# Patient Record
Sex: Female | Born: 1960 | Race: White | Hispanic: No | Marital: Married | Smoking: Former smoker
Health system: Southern US, Community
[De-identification: ages and names within clinical notes are randomized; demographics above are authoritative.]

## PROBLEM LIST (undated history)

## (undated) DIAGNOSIS — E079 Disorder of thyroid, unspecified: Secondary | ICD-10-CM

## (undated) DIAGNOSIS — M5090 Cervical disc disorder, unspecified, unspecified cervical region: Secondary | ICD-10-CM

## (undated) DIAGNOSIS — M5126 Other intervertebral disc displacement, lumbar region: Secondary | ICD-10-CM

## (undated) DIAGNOSIS — Z87820 Personal history of traumatic brain injury: Secondary | ICD-10-CM

## (undated) DIAGNOSIS — E785 Hyperlipidemia, unspecified: Secondary | ICD-10-CM

## (undated) HISTORY — DX: Personal history of traumatic brain injury: Z87.820

## (undated) HISTORY — DX: Other intervertebral disc displacement, lumbar region: M51.26

## (undated) HISTORY — DX: Disorder of thyroid, unspecified: E07.9

## (undated) HISTORY — PX: DILATION AND CURETTAGE OF UTERUS: SHX78

## (undated) HISTORY — DX: Cervical disc disorder, unspecified, unspecified cervical region: M50.90

## (undated) HISTORY — DX: Hyperlipidemia, unspecified: E78.5

---

## 1998-08-20 ENCOUNTER — Other Ambulatory Visit: Admission: RE | Admit: 1998-08-20 | Discharge: 1998-08-20 | Payer: Self-pay | Admitting: Family Medicine

## 1999-06-13 ENCOUNTER — Encounter: Admission: RE | Admit: 1999-06-13 | Discharge: 1999-06-13 | Payer: Self-pay | Admitting: *Deleted

## 1999-10-14 ENCOUNTER — Other Ambulatory Visit: Admission: RE | Admit: 1999-10-14 | Discharge: 1999-10-14 | Payer: Self-pay

## 2000-05-13 ENCOUNTER — Encounter: Admission: RE | Admit: 2000-05-13 | Discharge: 2000-06-15 | Payer: Self-pay | Admitting: *Deleted

## 2000-07-30 ENCOUNTER — Other Ambulatory Visit: Admission: RE | Admit: 2000-07-30 | Discharge: 2000-07-30 | Payer: Self-pay | Admitting: Family Medicine

## 2001-08-09 ENCOUNTER — Other Ambulatory Visit: Admission: RE | Admit: 2001-08-09 | Discharge: 2001-08-09 | Payer: Self-pay | Admitting: Family Medicine

## 2002-08-22 ENCOUNTER — Other Ambulatory Visit: Admission: RE | Admit: 2002-08-22 | Discharge: 2002-08-22 | Payer: Self-pay | Admitting: Family Medicine

## 2003-09-10 ENCOUNTER — Other Ambulatory Visit: Admission: RE | Admit: 2003-09-10 | Discharge: 2003-09-10 | Payer: Self-pay | Admitting: Family Medicine

## 2004-11-11 ENCOUNTER — Other Ambulatory Visit: Admission: RE | Admit: 2004-11-11 | Discharge: 2004-11-11 | Payer: Self-pay | Admitting: Family Medicine

## 2006-03-16 ENCOUNTER — Other Ambulatory Visit: Admission: RE | Admit: 2006-03-16 | Discharge: 2006-03-16 | Payer: Self-pay | Admitting: Family Medicine

## 2012-01-01 ENCOUNTER — Other Ambulatory Visit: Payer: Self-pay | Admitting: Family Medicine

## 2012-01-01 DIAGNOSIS — R102 Pelvic and perineal pain: Secondary | ICD-10-CM

## 2012-01-05 ENCOUNTER — Ambulatory Visit
Admission: RE | Admit: 2012-01-05 | Discharge: 2012-01-05 | Disposition: A | Discharge status: Home / Self Care | Payer: BC Managed Care – PPO | Source: Ambulatory Visit | Attending: Family Medicine | Admitting: Family Medicine

## 2012-01-05 DIAGNOSIS — R102 Pelvic and perineal pain: Secondary | ICD-10-CM

## 2012-06-30 ENCOUNTER — Encounter: Payer: Self-pay | Admitting: Internal Medicine

## 2012-08-15 ENCOUNTER — Encounter: Payer: BC Managed Care – PPO | Admitting: Internal Medicine

## 2013-07-05 ENCOUNTER — Telehealth: Payer: Self-pay | Admitting: *Deleted

## 2013-07-05 NOTE — Telephone Encounter (Signed)
PT NEEDS KARIVA 28 FILLED TO [facility]. I COULDN'T LOCATE IN DATABASE TO PUT MED ORDER IN. CAN YOU FILL PLEASE. HAS OFFICE VISIT WITH MMM THIS MONTH. LAST PAP 2/12.

## 2013-07-07 MED ORDER — DESOGESTREL-ETHINYL ESTRADIOL 0.15-0.02/0.01 MG (21/5) PO TABS: 1.0000 | ORAL_TABLET | Freq: Every day | ORAL | Status: DC

## 2013-07-07 NOTE — Telephone Encounter (Signed)
Has appt for pap this month with you

## 2013-07-10 ENCOUNTER — Other Ambulatory Visit: Payer: Self-pay | Admitting: General Practice

## 2013-07-10 DIAGNOSIS — IMO0001 Reserved for inherently not codable concepts without codable children: Secondary | ICD-10-CM

## 2013-07-10 MED ORDER — DESOGESTREL-ETHINYL ESTRADIOL 0.15-0.02/0.01 MG (21/5) PO TABS: 1.0000 | ORAL_TABLET | Freq: Every day | ORAL | Status: DC

## 2013-07-10 NOTE — Telephone Encounter (Signed)
Med called in and script sent

## 2013-07-11 ENCOUNTER — Other Ambulatory Visit: Payer: Self-pay

## 2013-07-11 DIAGNOSIS — IMO0001 Reserved for inherently not codable concepts without codable children: Secondary | ICD-10-CM

## 2013-07-11 MED ORDER — DESOGESTREL-ETHINYL ESTRADIOL 0.15-0.02/0.01 MG (21/5) PO TABS: 1.0000 | ORAL_TABLET | Freq: Every day | ORAL | Status: DC

## 2013-07-12 ENCOUNTER — Other Ambulatory Visit: Payer: Self-pay | Admitting: Nurse Practitioner

## 2013-07-18 ENCOUNTER — Other Ambulatory Visit: Payer: Self-pay | Admitting: Nurse Practitioner

## 2013-07-26 ENCOUNTER — Encounter: Payer: Self-pay | Admitting: Nurse Practitioner

## 2013-07-26 ENCOUNTER — Ambulatory Visit (INDEPENDENT_AMBULATORY_CARE_PROVIDER_SITE_OTHER): Payer: PRIVATE HEALTH INSURANCE | Admitting: Nurse Practitioner

## 2013-07-26 VITALS — BP 141/80 | HR 90 | Temp 100.1°F | Ht 64.0 in | Wt 139.0 lb

## 2013-07-26 DIAGNOSIS — Z23 Encounter for immunization: Secondary | ICD-10-CM

## 2013-07-26 DIAGNOSIS — Z01419 Encounter for gynecological examination (general) (routine) without abnormal findings: Secondary | ICD-10-CM

## 2013-07-26 DIAGNOSIS — Z0000 Encounter for general adult medical examination without abnormal findings: Secondary | ICD-10-CM

## 2013-07-26 DIAGNOSIS — E039 Hypothyroidism, unspecified: Secondary | ICD-10-CM | POA: Insufficient documentation

## 2013-07-26 DIAGNOSIS — Z124 Encounter for screening for malignant neoplasm of cervix: Secondary | ICD-10-CM

## 2013-07-26 LAB — POCT CBC
Granulocyte percent: 59.4 %G (ref 37–80)
HCT, POC: 39.2 % (ref 37.7–47.9)
POC Granulocyte: 6 (ref 2–6.9)
RBC: 4.1 M/uL (ref 4.04–5.48)
RDW, POC: 12.3 %
WBC: 10.1 10*3/uL (ref 4.6–10.2)

## 2013-07-26 NOTE — Patient Instructions (Addendum)

## 2013-07-26 NOTE — Progress Notes (Signed)
Subjective:    Patient ID: Amanda Hardy, female    DOB: Mar 30, 1961, 52 y.o.   MRN: [id number]  HPI Patient here today for CPE and PAP- She is doing well without compalints. Current medical problems: Patient Active Problem List   Diagnosis Date Noted  . Hypothyroidism 07/26/2013   Outpatient Encounter Prescriptions as of 07/26/2013  Medication Sig Dispense Refill  . desogestrel-ethinyl estradiol (KARIVA,AZURETTE,MIRCETTE) 0.15-0.02/0.01 MG (21/5) tablet Take 1 tablet by mouth daily.  1 Package  3  . levothyroxine (SYNTHROID, LEVOTHROID) 50 MCG tablet Take 50 mcg by mouth daily before breakfast.       No facility-administered encounter medications on file as of 07/26/2013.         Review of Systems  Constitutional: Positive for fever. Negative for chills.  HENT: Positive for congestion, rhinorrhea and sneezing.   Eyes: Negative.   Respiratory: Negative.   Cardiovascular: Negative.   Gastrointestinal: Negative.   Endocrine: Negative.   Genitourinary: Negative.   Musculoskeletal: Negative.   Neurological: Negative.        Objective:   Physical Exam  Vitals reviewed. Constitutional: She is oriented to person, place, and time. She appears well-developed and well-nourished.  HENT:  Head: Normocephalic.  Right Ear: Hearing, tympanic membrane, external ear and ear canal normal.  Left Ear: Hearing, tympanic membrane, external ear and ear canal normal.  Nose: Nose normal.  Mouth/Throat: Uvula is midline and oropharynx is clear and moist.  Eyes: Conjunctivae and EOM are normal. Pupils are equal, round, and reactive to light.  Neck: Normal range of motion and full passive range of motion without pain. Neck supple. No JVD present. Carotid bruit is not present. No mass and no thyromegaly present.  Cardiovascular: Normal rate, normal heart sounds and intact distal pulses.   No murmur heard. Pulmonary/Chest: Effort normal and breath sounds normal. Right breast exhibits no  inverted nipple, no mass, no nipple discharge, no skin change and no tenderness. Left breast exhibits no inverted nipple, no mass, no nipple discharge, no skin change and no tenderness.  Abdominal: Soft. Bowel sounds are normal. She exhibits no mass. There is no tenderness.  Genitourinary: Vagina normal and uterus normal. No breast swelling, tenderness, discharge or bleeding.  bimanual exam-No adnexal masses or tenderness.  Cervix parous and pink  Musculoskeletal: Normal range of motion.  Lymphadenopathy:    She has no cervical adenopathy.  Neurological: She is alert and oriented to person, place, and time.  Skin: Skin is warm and dry.  Psychiatric: She has a normal mood and affect. Her behavior is normal. Judgment and thought content normal.     BP 141/80  Pulse 90  Temp(Src) 100.1 F (37.8 C) (Oral)  Ht 5\' 4"  (1.626 m)  Wt 139 lb (63.05 kg)  BMI 23.85 kg/m2      Assessment & Plan:   1. Hypothyroidism   2. Annual physical exam   3. Encounter for routine gynecological examination    Orders Placed This Encounter  Procedures  . CMP14+EGFR  . NMR, lipoprofile  . Thyroid Panel With TSH  . POCT CBC   Meds ordered this encounter  Medications  . levothyroxine (SYNTHROID, LEVOTHROID) 50 MCG tablet    Sig: Take 50 mcg by mouth daily before breakfast.    Continue all meds Labs pending Diet and exercise encouraged Health maintenance reviewed Follow up in 6 months discussed extended use of birth control- patient is going to stop taking and see how she does  Mary-Margaret Daphine Deutscher, FNP

## 2013-07-28 LAB — NMR, LIPOPROFILE
Cholesterol: 226 mg/dL — ABNORMAL HIGH (ref <200)
HDL Particle Number: 49.1 umol/L (ref >=30.5)
LDL Particle Number: 2666 nmol/L — ABNORMAL HIGH (ref <1000)
LDL Size: 20.3 nm — ABNORMAL LOW (ref >20.5)
Small LDL Particle Number: 1685 nmol/L — ABNORMAL HIGH (ref <=527)
Triglycerides by NMR: 134 mg/dL (ref <150)

## 2013-07-28 LAB — CMP14+EGFR
Albumin/Globulin Ratio: 1.5 (ref 1.1–2.5)
Albumin: 4.3 g/dL (ref 3.5–5.5)
Alkaline Phosphatase: 39 IU/L (ref 39–117)
BUN: 10 mg/dL (ref 6–24)
Creatinine, Ser: 0.79 mg/dL (ref 0.57–1.00)
GFR calc Af Amer: 99 mL/min/{1.73_m2} (ref >59)
GFR calc non Af Amer: 86 mL/min/{1.73_m2} (ref >59)
Globulin, Total: 2.9 g/dL (ref 1.5–4.5)
Glucose: 118 mg/dL — ABNORMAL HIGH (ref 65–99)
Sodium: 140 mmol/L (ref 134–144)
Total Bilirubin: 0.1 mg/dL (ref 0.0–1.2)
Total Protein: 7.2 g/dL (ref 6.0–8.5)

## 2013-07-28 LAB — THYROID PANEL WITH TSH
T3 Uptake Ratio: 20 % — ABNORMAL LOW (ref 24–39)
T4, Total: 9.4 ug/dL (ref 4.5–12.0)

## 2013-07-31 LAB — PAP IG W/ RFLX HPV ASCU: PAP Smear Comment: 0

## 2013-08-01 ENCOUNTER — Other Ambulatory Visit: Payer: Self-pay | Admitting: Nurse Practitioner

## 2013-08-01 MED ORDER — LEVOTHYROXINE SODIUM 88 MCG PO TABS
88.0000 ug | ORAL_TABLET | Freq: Every day | ORAL | Status: DC
Start: 2013-08-01 — End: 2013-11-21

## 2013-09-15 ENCOUNTER — Other Ambulatory Visit (INDEPENDENT_AMBULATORY_CARE_PROVIDER_SITE_OTHER): Payer: PRIVATE HEALTH INSURANCE

## 2013-09-15 DIAGNOSIS — E039 Hypothyroidism, unspecified: Secondary | ICD-10-CM

## 2013-09-15 NOTE — Progress Notes (Signed)
Pt came in for labs only 

## 2013-11-21 ENCOUNTER — Other Ambulatory Visit: Payer: Self-pay

## 2013-11-21 MED ORDER — LEVOTHYROXINE SODIUM 88 MCG PO TABS: 88.0000 ug | ORAL_TABLET | Freq: Every day | ORAL | Status: DC

## 2013-12-25 ENCOUNTER — Encounter: Payer: Self-pay | Admitting: Family Medicine

## 2013-12-25 ENCOUNTER — Ambulatory Visit (INDEPENDENT_AMBULATORY_CARE_PROVIDER_SITE_OTHER): Payer: 59 | Admitting: Family Medicine

## 2013-12-25 VITALS — BP 136/84 | HR 104 | Temp 99.1°F | Ht 64.5 in | Wt 140.2 lb

## 2013-12-25 DIAGNOSIS — R52 Pain, unspecified: Secondary | ICD-10-CM

## 2013-12-25 DIAGNOSIS — J029 Acute pharyngitis, unspecified: Secondary | ICD-10-CM

## 2013-12-25 DIAGNOSIS — R509 Fever, unspecified: Secondary | ICD-10-CM

## 2013-12-25 DIAGNOSIS — B029 Zoster without complications: Secondary | ICD-10-CM

## 2013-12-25 LAB — POCT INFLUENZA A/B
Influenza A, POC: NEGATIVE
Influenza B, POC: NEGATIVE

## 2013-12-25 LAB — POCT RAPID STREP A (OFFICE): Rapid Strep A Screen: POSITIVE — AB

## 2013-12-25 MED ORDER — ACYCLOVIR 800 MG PO TABS: 800.0000 mg | ORAL_TABLET | Freq: Every day | ORAL | Status: DC

## 2013-12-25 MED ORDER — AZITHROMYCIN 250 MG PO TABS: ORAL_TABLET | ORAL | Status: DC

## 2013-12-25 NOTE — Progress Notes (Signed)
   Subjective:    Patient ID: Amanda Hardy, female    DOB: 11-Oct-1961, 53 y.o.   MRN: [id number]  HPI  This 53 y.o. female presents for evaluation of sore throat, headache, fever, malaise, and she has  A rash over her back that is numb and painful.  Review of Systems C/o sore throat, headache, malaise, and rash   No chest pain, SOB,dizziness, vision change, N/V, diarrhea, constipation, dysuria, urinary urgency or frequency  Objective:   Physical Exam Vital signs noted  Well developed well nourished female.  HEENT - Head atraumatic Normocephalic                Eyes - PERRLA, Conjuctiva - clear Sclera- Clear EOMI                Ears - EAC's Wnl TM's Wnl Gross Hearing WNL                Nose - Nares patent                 Throat - oropharanx injected with exudates Respiratory - Lungs CTA bilateral Cardiac - RRR S1 and S2 without murmur GI - Abdomen soft Nontender and bowel sounds active x 4 Extremities - No edema. Neuro - Grossly intact. Skin - erythematous rash with vesicle over left back      Results for orders placed in visit on 12/25/13  POCT RAPID STREP A (OFFICE)      Result Value Ref Range   Rapid Strep A Screen Positive (*) Negative  POCT INFLUENZA A/B      Result Value Ref Range   Influenza A, POC Negative     Influenza B, POC Negative     Assessment & Plan:  Sore throat - Plan: POCT rapid strep A, POCT Influenza A/B, azithromycin (ZITHROMAX) 250 MG tablet  Fever - Plan: POCT rapid strep A, POCT Influenza A/B, azithromycin (ZITHROMAX) 250 MG tablet  Body aches - Plan: POCT rapid strep A, POCT Influenza A/B, azithromycin (ZITHROMAX) 250 MG tablet  Acute pharyngitis - Plan: azithromycin (ZITHROMAX) 250 MG tablet  Shingles - Plan: acyclovir (ZOVIRAX) 800 MG tablet  Push po fluids, rest, tylenol and motrin otc prn as directed for fever, arthralgias, and myalgias.  Follow up prn if sx's continue or persist.  Deatra CanterWilliam J Oxford FNP

## 2014-01-24 ENCOUNTER — Encounter: Payer: Self-pay | Admitting: Nurse Practitioner

## 2014-01-24 ENCOUNTER — Ambulatory Visit (INDEPENDENT_AMBULATORY_CARE_PROVIDER_SITE_OTHER): Payer: 59 | Admitting: Nurse Practitioner

## 2014-01-24 VITALS — BP 148/71 | HR 87 | Temp 97.2°F | Ht 64.5 in | Wt 138.2 lb

## 2014-01-24 DIAGNOSIS — IMO0001 Reserved for inherently not codable concepts without codable children: Secondary | ICD-10-CM

## 2014-01-24 DIAGNOSIS — E039 Hypothyroidism, unspecified: Secondary | ICD-10-CM

## 2014-01-24 DIAGNOSIS — Z309 Encounter for contraceptive management, unspecified: Secondary | ICD-10-CM

## 2014-01-24 MED ORDER — DESOGESTREL-ETHINYL ESTRADIOL 0.15-0.02/0.01 MG (21/5) PO TABS: 1.0000 | ORAL_TABLET | Freq: Every day | ORAL | Status: DC

## 2014-01-24 NOTE — Patient Instructions (Signed)
Fat and Cholesterol Control Diet Fat and cholesterol levels in your blood and organs are influenced by your diet. High levels of fat and cholesterol may lead to diseases of the heart, small and large blood vessels, gallbladder, liver, and pancreas. CONTROLLING FAT AND CHOLESTEROL WITH DIET Although exercise and lifestyle factors are important, your diet is key. That is because certain foods are known to raise cholesterol and others to lower it. The goal is to balance foods for their effect on cholesterol and more importantly, to replace saturated and trans fat with other types of fat, such as monounsaturated fat, polyunsaturated fat, and omega-3 fatty acids. On average, a person should consume no more than 15 to 17 g of saturated fat daily. Saturated and trans fats are considered "bad" fats, and they will raise LDL cholesterol. Saturated fats are primarily found in animal products such as meats, butter, and cream. However, that does not mean you need to give up all your favorite foods. Today, there are good tasting, low-fat, low-cholesterol substitutes for most of the things you like to eat. Choose low-fat or nonfat alternatives. Choose round or loin cuts of red meat. These types of cuts are lowest in fat and cholesterol. Chicken (without the skin), fish, veal, and ground turkey breast are great choices. Eliminate fatty meats, such as hot dogs and salami. Even shellfish have little or no saturated fat. Have a 3 oz (85 g) portion when you eat lean meat, poultry, or fish. Trans fats are also called "partially hydrogenated oils." They are oils that have been scientifically manipulated so that they are solid at room temperature resulting in a longer shelf life and improved taste and texture of foods in which they are added. Trans fats are found in stick margarine, some tub margarines, cookies, crackers, and baked goods.  When baking and cooking, oils are a great substitute for butter. The monounsaturated oils are  especially beneficial since it is believed they lower LDL and raise HDL. The oils you should avoid entirely are saturated tropical oils, such as coconut and palm.  Remember to eat a lot from food groups that are naturally free of saturated and trans fat, including fish, fruit, vegetables, beans, grains (barley, rice, couscous, bulgur wheat), and pasta (without cream sauces).  IDENTIFYING FOODS THAT LOWER FAT AND CHOLESTEROL  Soluble fiber may lower your cholesterol. This type of fiber is found in fruits such as apples, vegetables such as broccoli, potatoes, and carrots, legumes such as beans, peas, and lentils, and grains such as barley. Foods fortified with plant sterols (phytosterol) may also lower cholesterol. You should eat at least 2 g per day of these foods for a cholesterol lowering effect.  Read package labels to identify low-saturated fats, trans fat free, and low-fat foods at the supermarket. Select cheeses that have only 2 to 3 g saturated fat per ounce. Use a heart-healthy tub margarine that is free of trans fats or partially hydrogenated oil. When buying baked goods (cookies, crackers), avoid partially hydrogenated oils. Breads and muffins should be made from whole grains (whole-wheat or whole oat flour, instead of "flour" or "enriched flour"). Buy non-creamy canned soups with reduced salt and no added fats.  FOOD PREPARATION TECHNIQUES  Never deep-fry. If you must fry, either stir-fry, which uses very little fat, or use non-stick cooking sprays. When possible, broil, bake, or roast meats, and steam vegetables. Instead of putting butter or margarine on vegetables, use lemon and herbs, applesauce, and cinnamon (for squash and sweet potatoes). Use nonfat   yogurt, salsa, and low-fat dressings for salads.  LOW-SATURATED FAT / LOW-FAT FOOD SUBSTITUTES Meats / Saturated Fat (g)  Avoid: Steak, marbled (3 oz/85 g) / 11 g  Choose: Steak, lean (3 oz/85 g) / 4 g  Avoid: Hamburger (3 oz/85 g) / 7  g  Choose: Hamburger, lean (3 oz/85 g) / 5 g  Avoid: Ham (3 oz/85 g) / 6 g  Choose: Ham, lean cut (3 oz/85 g) / 2.4 g  Avoid: Chicken, with skin, dark meat (3 oz/85 g) / 4 g  Choose: Chicken, skin removed, dark meat (3 oz/85 g) / 2 g  Avoid: Chicken, with skin, light meat (3 oz/85 g) / 2.5 g  Choose: Chicken, skin removed, light meat (3 oz/85 g) / 1 g Dairy / Saturated Fat (g)  Avoid: Whole milk (1 cup) / 5 g  Choose: Low-fat milk, 2% (1 cup) / 3 g  Choose: Low-fat milk, 1% (1 cup) / 1.5 g  Choose: Skim milk (1 cup) / 0.3 g  Avoid: Hard cheese (1 oz/28 g) / 6 g  Choose: Skim milk cheese (1 oz/28 g) / 2 to 3 g  Avoid: Cottage cheese, 4% fat (1 cup) / 6.5 g  Choose: Low-fat cottage cheese, 1% fat (1 cup) / 1.5 g  Avoid: Ice cream (1 cup) / 9 g  Choose: Sherbet (1 cup) / 2.5 g  Choose: Nonfat frozen yogurt (1 cup) / 0.3 g  Choose: Frozen fruit bar / trace  Avoid: Whipped cream (1 tbs) / 3.5 g  Choose: Nondairy whipped topping (1 tbs) / 1 g Condiments / Saturated Fat (g)  Avoid: Mayonnaise (1 tbs) / 2 g  Choose: Low-fat mayonnaise (1 tbs) / 1 g  Avoid: Butter (1 tbs) / 7 g  Choose: Extra light margarine (1 tbs) / 1 g  Avoid: Coconut oil (1 tbs) / 11.8 g  Choose: Olive oil (1 tbs) / 1.8 g  Choose: Corn oil (1 tbs) / 1.7 g  Choose: Safflower oil (1 tbs) / 1.2 g  Choose: Sunflower oil (1 tbs) / 1.4 g  Choose: Soybean oil (1 tbs) / 2.4 g  Choose: Canola oil (1 tbs) / 1 g Document Released: 10/19/2005 Document Revised: 02/13/2013 Document Reviewed: 04/09/2011 ExitCare Patient Information 2014 [place], [place]. Sodium-Controlled Diet Sodium is a mineral. It is found in many foods. Sodium may be found naturally or added during the making of a food. The most common form of sodium is salt, which is made up of sodium and chloride. Reducing your sodium intake involves changing your eating habits. The following guidelines will help you reduce the sodium in your  diet:  Stop using the salt shaker.  Use salt sparingly in cooking and baking.  Substitute with sodium-free seasonings and spices.  Do not use a salt substitute (potassium chloride) without your caregiver's permission.  Include a variety of fresh, unprocessed foods in your diet.  Limit the use of processed and convenience foods that are high in sodium. USE THE FOLLOWING FOODS SPARINGLY: Breads/Starches  Commercial bread stuffing, commercial pancake or waffle mixes, coating mixes. Waffles. Croutons. Prepared (boxed or frozen) potato, rice, or noodle mixes that contain salt or sodium. Salted [place] fries or hash browns. Salted popcorn, breads, crackers, chips, or snack foods. Vegetables  Vegetables canned with salt or prepared in cream, butter, or cheese sauces. Sauerkraut. Tomato or vegetable juices canned with salt.  Fresh vegetables are allowed if rinsed thoroughly. Fruit  Fruit is okay to eat. Meat and Meat  Substitutes  Salted or smoked meats, such as bacon or Canadian bacon, chipped or corned beef, hot dogs, salt pork, luncheon meats, pastrami, ham, or sausage. Canned or smoked fish, poultry, or meat. Processed cheese or cheese spreads, blue or [place] cheese. Battered or frozen fish products. Prepared spaghetti sauce. Baked beans. Reuben sandwiches. Salted nuts. Caviar. Milk  Limit buttermilk to 1 cup per week. Soups and Combination Foods  Bouillon cubes, canned or dried soups, broth, consomm. Convenience (frozen or packaged) dinners with more than 600 mg sodium. Pot pies, pizza, Asian food, fast food cheeseburgers, and specialty sandwiches. Desserts and Sweets  Regular (salted) desserts, pie, commercial fruit snack pies, commercial snack cakes, canned puddings.  Eat desserts and sweets in moderation. Fats and Oils  Gravy mixes or canned gravy. No more than 1 to 2 tbs of salad dressing. Chip dips.  Eat fats and oils in moderation. Beverages  See those listed under  the vegetables and milk groups. Condiments  Ketchup, mustard, meat sauces, salsa, regular (salted) and lite soy sauce or mustard. Dill pickles, olives, meat tenderizer. Prepared horseradish or pickle relish. Dutch-processed cocoa. Baking powder or baking soda used medicinally. Worcestershire sauce. "Light" salt. Salt substitute, unless approved by your caregiver. Document Released: 04/10/2002 Document Revised: 01/11/2012 Document Reviewed: 11/11/2009 [facility] Patient Information 2014 [place], [place].

## 2014-01-24 NOTE — Progress Notes (Signed)
   Subjective:    Patient ID: Amanda Hardy, female    DOB: 07/19/1961, 53 y.o.   MRN: [id number]  HPI Patient presents today for follow up of chronic medical problems. Is complaining of body aches. Had shingles last month that has since resolved.  Hypothyroidism Currently taking Synthyroid 88 mcg - exercises daily for at 30 mins. Denies fatigue. Has not been maintaining low fat diet.    Review of Systems  Constitutional: Negative for unexpected weight change.  Respiratory: Negative for shortness of breath.   Cardiovascular: Negative for chest pain and palpitations.  Gastrointestinal: Negative for nausea, diarrhea and constipation.  Endocrine: Negative for cold intolerance and heat intolerance.  Neurological: Negative for headaches.  Psychiatric/Behavioral: Negative for sleep disturbance.  All other systems reviewed and are negative.       Objective:   Physical Exam  Constitutional: She is oriented to person, place, and time. She appears well-developed and well-nourished.  HENT:  Head: Normocephalic.  Right Ear: External ear normal.  Mouth/Throat: Oropharynx is clear and moist.  Eyes: Pupils are equal, round, and reactive to light.  Neck: Normal range of motion. Neck supple.  Cardiovascular: Normal rate, regular rhythm and normal heart sounds.   Pulmonary/Chest: Effort normal and breath sounds normal.  Abdominal: Soft. Bowel sounds are normal.  Neurological: She is alert and oriented to person, place, and time.  Skin: Skin is warm and dry.  Psychiatric: She has a normal mood and affect. Her behavior is normal. Judgment and thought content normal.    BP 148/71  Pulse 87  Temp(Src) 97.2 F (36.2 C) (Oral)  Ht 5' 4.5" (1.638 m)  Wt 138 lb 3.2 oz (62.687 kg)  BMI 23.36 kg/m2  LMP 01/24/2014       Assessment & Plan:   1. Unspecified hypothyroidism   2. Birth control    Orders Placed This Encounter  Procedures  . CMP14+EGFR  . NMR, lipoprofile  .  Thyroid Panel With TSH   Meds ordered this encounter  Medications  . desogestrel-ethinyl estradiol (KARIVA,AZURETTE,MIRCETTE) 0.15-0.02/0.01 MG (21/5) tablet    Sig: Take 1 tablet by mouth daily.    Dispense:  1 Package    Refill:  11    Order Specific Question:  Supervising Provider    Answer:  Chipper Herb [place] pending Health maintenance reviewed Diet and exercise encouraged Continue all meds Follow up  In 1 year   [place], FNP

## 2014-01-26 ENCOUNTER — Other Ambulatory Visit: Payer: Self-pay | Admitting: Nurse Practitioner

## 2014-01-26 LAB — THYROID PANEL WITH TSH
FREE THYROXINE INDEX: 3.1 (ref 1.2–4.9)
T3 Uptake Ratio: 28 % (ref 24–39)
T4, Total: 11 ug/dL (ref 4.5–12.0)
TSH: 0.138 u[IU]/mL — AB (ref 0.450–4.500)

## 2014-01-26 LAB — CMP14+EGFR
ALT: 88 IU/L — ABNORMAL HIGH (ref 0–32)
AST: 46 IU/L — AB (ref 0–40)
Albumin/Globulin Ratio: 1.6 (ref 1.1–2.5)
Albumin: 4.6 g/dL (ref 3.5–5.5)
Alkaline Phosphatase: 59 IU/L (ref 39–117)
BUN / CREAT RATIO: 18 (ref 9–23)
BUN: 14 mg/dL (ref 6–24)
CALCIUM: 9.9 mg/dL (ref 8.7–10.2)
CHLORIDE: 100 mmol/L (ref 97–108)
CO2: 25 mmol/L (ref 18–29)
Creatinine, Ser: 0.78 mg/dL (ref 0.57–1.00)
GFR calc Af Amer: 100 mL/min/{1.73_m2} (ref >59)
GFR calc non Af Amer: 86 mL/min/{1.73_m2} (ref >59)
Globulin, Total: 2.8 g/dL (ref 1.5–4.5)
Glucose: 105 mg/dL — ABNORMAL HIGH (ref 65–99)
Potassium: 4 mmol/L (ref 3.5–5.2)
SODIUM: 140 mmol/L (ref 134–144)
Total Bilirubin: 0.3 mg/dL (ref 0.0–1.2)
Total Protein: 7.4 g/dL (ref 6.0–8.5)

## 2014-01-26 LAB — NMR, LIPOPROFILE
Cholesterol: 209 mg/dL — ABNORMAL HIGH (ref <200)
HDL CHOLESTEROL BY NMR: 48 mg/dL (ref >=40)
HDL Particle Number: 26.3 umol/L — ABNORMAL LOW (ref >=30.5)
LDL Particle Number: 1696 nmol/L — ABNORMAL HIGH (ref <1000)
LDL Size: 21.1 nm (ref >20.5)
LDLC SERPL CALC-MCNC: 128 mg/dL — AB (ref <100)
LP-IR SCORE: 35 (ref <=45)
Small LDL Particle Number: 686 nmol/L — ABNORMAL HIGH (ref <=527)
Triglycerides by NMR: 164 mg/dL — ABNORMAL HIGH (ref <150)

## 2014-01-26 MED ORDER — LEVOTHYROXINE SODIUM 75 MCG PO TABS: 75.0000 ug | ORAL_TABLET | Freq: Every day | ORAL | Status: DC

## 2014-01-31 ENCOUNTER — Encounter: Payer: Self-pay | Admitting: *Deleted

## 2014-02-16 ENCOUNTER — Telehealth: Payer: Self-pay | Admitting: Nurse Practitioner

## 2014-02-16 MED ORDER — NORGESTIM-ETH ESTRAD TRIPHASIC 0.18/0.215/0.25 MG-35 MCG PO TABS: 1.0000 | ORAL_TABLET | Freq: Every day | ORAL | Status: DC

## 2014-02-16 NOTE — Telephone Encounter (Signed)
rx changed and sent to pharmacy

## 2014-07-16 ENCOUNTER — Other Ambulatory Visit: Payer: Self-pay | Admitting: Nurse Practitioner

## 2014-08-08 ENCOUNTER — Telehealth: Payer: Self-pay | Admitting: Nurse Practitioner

## 2014-08-09 NOTE — Telephone Encounter (Signed)
Patient wanted to come by for labwork but did not have an appointment scheduled. She was ask to schedule an appointment and have the provider order during visit.

## 2014-08-16 ENCOUNTER — Ambulatory Visit (INDEPENDENT_AMBULATORY_CARE_PROVIDER_SITE_OTHER): Payer: PRIVATE HEALTH INSURANCE | Admitting: Nurse Practitioner

## 2014-08-16 ENCOUNTER — Encounter: Payer: Self-pay | Admitting: Nurse Practitioner

## 2014-08-16 VITALS — BP 154/81 | HR 87 | Temp 98.9°F | Ht 64.5 in | Wt 135.8 lb

## 2014-08-16 DIAGNOSIS — E038 Other specified hypothyroidism: Secondary | ICD-10-CM

## 2014-08-16 DIAGNOSIS — E034 Atrophy of thyroid (acquired): Secondary | ICD-10-CM

## 2014-08-16 NOTE — Progress Notes (Signed)
   Subjective:    Patient ID: Amanda Hardy, female    DOB: Feb 12, 1961, 53 y.o.   MRN: [id number]  HPI Patient here today for follow up of chronic medical problems.  Hypothyroidism Currently taking Synthyroid 88 mcg - exercises daily for at 30 mins. Denies fatigue. Has not been maintaining low fat diet.    Review of Systems  Constitutional: Negative for unexpected weight change.  Respiratory: Negative for shortness of breath.   Cardiovascular: Negative for chest pain and palpitations.  Gastrointestinal: Negative for nausea, diarrhea and constipation.  Endocrine: Negative for cold intolerance and heat intolerance.  Neurological: Negative for headaches.  Psychiatric/Behavioral: Negative for sleep disturbance.  All other systems reviewed and are negative.      Objective:   Physical Exam  Constitutional: She is oriented to person, place, and time. She appears well-developed and well-nourished.  HENT:  Head: Normocephalic.  Right Ear: External ear normal.  Mouth/Throat: Oropharynx is clear and moist.  Eyes: Pupils are equal, round, and reactive to light.  Neck: Normal range of motion. Neck supple.  Cardiovascular: Normal rate, regular rhythm and normal heart sounds.   Pulmonary/Chest: Effort normal and breath sounds normal.  Abdominal: Soft. Bowel sounds are normal.  Neurological: She is alert and oriented to person, place, and time.  Skin: Skin is warm and dry.  Psychiatric: She has a normal mood and affect. Her behavior is normal. Judgment and thought content normal.    BP 154/81  Pulse 87  Temp(Src) 98.9 F (37.2 C) (Oral)  Ht 5' 4.5" (1.638 m)  Wt 135 lb 12.8 oz (61.598 kg)  BMI 22.96 kg/m2  LMP 08/16/2014       Assessment & Plan:    1. Hypothyroidism due to acquired atrophy of thyroid - CMP14+EGFR - NMR, lipoprofile - Thyroid Panel With TSH   Refuses immunizations- wants to wait on flu shot Labs pending Health maintenance reviewed Diet and  exercise encouraged Continue all meds Follow up  In 6 months   [place], FNP

## 2014-08-16 NOTE — Patient Instructions (Signed)

## 2014-08-17 ENCOUNTER — Telehealth: Payer: Self-pay | Admitting: Nurse Practitioner

## 2014-08-17 LAB — NMR, LIPOPROFILE
Cholesterol: 233 mg/dL — ABNORMAL HIGH (ref 100–199)
HDL CHOLESTEROL BY NMR: 49 mg/dL (ref >39)
HDL Particle Number: 42.2 umol/L (ref >=30.5)
LDL PARTICLE NUMBER: 1952 nmol/L — AB (ref <1000)
LDL Size: 20.8 nm (ref >20.5)
LDLC SERPL CALC-MCNC: 150 mg/dL — AB (ref 0–99)
LP-IR Score: 30 (ref <=45)
Small LDL Particle Number: 803 nmol/L — ABNORMAL HIGH (ref <=527)
Triglycerides by NMR: 171 mg/dL — ABNORMAL HIGH (ref 0–149)

## 2014-08-17 LAB — CMP14+EGFR
A/G RATIO: 1.5 (ref 1.1–2.5)
ALT: 8 IU/L (ref 0–32)
AST: 14 IU/L (ref 0–40)
Albumin: 4.6 g/dL (ref 3.5–5.5)
Alkaline Phosphatase: 45 IU/L (ref 39–117)
BUN/Creatinine Ratio: 17 (ref 9–23)
BUN: 13 mg/dL (ref 6–24)
CALCIUM: 9.6 mg/dL (ref 8.7–10.2)
CO2: 20 mmol/L (ref 18–29)
Chloride: 102 mmol/L (ref 97–108)
Creatinine, Ser: 0.78 mg/dL (ref 0.57–1.00)
GFR calc Af Amer: 100 mL/min/{1.73_m2} (ref >59)
GFR calc non Af Amer: 86 mL/min/{1.73_m2} (ref >59)
GLUCOSE: 103 mg/dL — AB (ref 65–99)
Globulin, Total: 3.1 g/dL (ref 1.5–4.5)
POTASSIUM: 3.7 mmol/L (ref 3.5–5.2)
Sodium: 141 mmol/L (ref 134–144)
Total Bilirubin: <0.2 mg/dL (ref 0.0–1.2)
Total Protein: 7.7 g/dL (ref 6.0–8.5)

## 2014-08-17 LAB — THYROID PANEL WITH TSH
Free Thyroxine Index: 2 (ref 1.2–4.9)
T3 UPTAKE RATIO: 20 % — AB (ref 24–39)
T4 TOTAL: 9.9 ug/dL (ref 4.5–12.0)
TSH: 3.24 u[IU]/mL (ref 0.450–4.500)

## 2014-08-17 NOTE — Telephone Encounter (Signed)
Message copied by Doreatha MassedMOORE, MITZI on Fri Aug 17, 2014 11:08 AM ------      Message from: Bennie PieriniMARTIN, MARY-MARGARET      Created: Fri Aug 17, 2014  8:58 AM       Kidney and liver function stable      LDL particle numbers are elevated as well as LDL- currently low CVA risk- low fat diet and exercise      Continue current meds- low fat diet and exercise and recheck in 3 months       ------

## 2014-08-27 NOTE — Telephone Encounter (Signed)
Patient aware.

## 2014-10-11 ENCOUNTER — Other Ambulatory Visit: Payer: Self-pay | Admitting: Nurse Practitioner

## 2014-10-12 NOTE — Telephone Encounter (Signed)
Refilled per protocol; TSH done 08/2014

## 2015-01-04 ENCOUNTER — Ambulatory Visit (INDEPENDENT_AMBULATORY_CARE_PROVIDER_SITE_OTHER): Payer: BLUE CROSS/BLUE SHIELD | Admitting: Family

## 2015-01-04 ENCOUNTER — Encounter: Payer: Self-pay | Admitting: Family

## 2015-01-04 VITALS — BP 141/84 | HR 88 | Temp 98.3°F | Ht 64.5 in | Wt 134.0 lb

## 2015-01-04 DIAGNOSIS — J069 Acute upper respiratory infection, unspecified: Secondary | ICD-10-CM

## 2015-01-04 MED ORDER — AZITHROMYCIN 250 MG PO TABS: ORAL_TABLET | ORAL | Status: DC

## 2015-01-04 NOTE — Progress Notes (Signed)
   Subjective:    Patient ID: Amanda Hardy, female    DOB: 03-30-1961, 54 y.o.   MRN: [id number]  Sore Throat  This is a new problem. The current episode started 1 to 4 weeks ago. The problem has been unchanged. There has been no fever. The pain is mild. Associated symptoms include congestion, coughing, ear pain, headaches, a hoarse voice, shortness of breath and trouble swallowing. Pertinent negatives include no ear discharge or vomiting. She has had no exposure to strep or mono. She has tried acetaminophen for the symptoms. The treatment provided mild relief.      Review of Systems  Constitutional: Negative.   HENT: Positive for congestion, ear pain, hoarse voice and trouble swallowing. Negative for ear discharge.   Eyes: Negative.   Respiratory: Positive for cough and shortness of breath.   Cardiovascular: Negative.  Negative for palpitations.  Gastrointestinal: Negative.  Negative for vomiting.  Endocrine: Negative.   Genitourinary: Negative.   Musculoskeletal: Negative.   Neurological: Positive for headaches.  Hematological: Negative.   Psychiatric/Behavioral: Negative.   All other systems reviewed and are negative.      Objective:   Physical Exam  Constitutional: She is oriented to person, place, and time. She appears well-developed and well-nourished. No distress.  HENT:  Head: Normocephalic and atraumatic.  Right Ear: External ear normal.  Left Ear: External ear normal.  Mouth/Throat: Oropharynx is clear and moist.  Nasal passage erythemas with mild swelling    Eyes: Pupils are equal, round, and reactive to light.  Neck: Normal range of motion. Neck supple. No thyromegaly present.  Cardiovascular: Normal rate, regular rhythm, normal heart sounds and intact distal pulses.   No murmur heard. Pulmonary/Chest: Effort normal and breath sounds normal. No respiratory distress. She has no wheezes.  Abdominal: Soft. Bowel sounds are normal. She exhibits no  distension. There is no tenderness.  Musculoskeletal: Normal range of motion. She exhibits no edema or tenderness.  Neurological: She is alert and oriented to person, place, and time. She has normal reflexes. No cranial nerve deficit.  Skin: Skin is warm and dry.  Psychiatric: She has a normal mood and affect. Her behavior is normal. Judgment and thought content normal.  Vitals reviewed.     BP 141/84 mmHg  Pulse 88  Temp(Src) 98.3 F (36.8 C) (Oral)  Ht 5' 4.5" (1.638 m)  Wt 134 lb (60.782 kg)  BMI 22.65 kg/m2     Assessment & Plan:  1. Acute upper respiratory infection -- Take meds as prescribed - Use a cool mist humidifier  -Use saline nose sprays frequently -Saline irrigations of the nose can be very helpful if done frequently.  * 4X daily for 1 week*  * Use of a nettie pot can be helpful with this. Follow directions with this* -Force fluids -For any cough or congestion  Use plain Mucinex- regular strength or max strength is fine   * Children- consult with Pharmacist for dosing -For fever or aces or pains- take tylenol or ibuprofen appropriate for age and weight.  * for fevers greater than 101 orally you may alternate ibuprofen and tylenol every  3 hours. -Throat lozenges if help - azithromycin (ZITHROMAX) 250 MG tablet; Take 500 mg once, then 250 mg for four days  Dispense: 6 tablet; Refill: 0  Jannifer Rodneyhristy Anwyn Kriegel, FNP

## 2015-01-04 NOTE — Patient Instructions (Signed)
Upper Respiratory Infection, Adult An upper respiratory infection (URI) is also sometimes known as the common cold. The upper respiratory tract includes the nose, sinuses, throat, trachea, and bronchi. Bronchi are the airways leading to the lungs. Most people improve within 1 week, but symptoms can last up to 2 weeks. A residual cough may last even longer.  CAUSES Many different viruses can infect the tissues lining the upper respiratory tract. The tissues become irritated and inflamed and often become very moist. Mucus production is also common. A cold is contagious. You can easily spread the virus to others by oral contact. This includes kissing, sharing a glass, coughing, or sneezing. Touching your mouth or nose and then touching a surface, which is then touched by another person, can also spread the virus. SYMPTOMS  Symptoms typically develop 1 to 3 days after you come in contact with a cold virus. Symptoms vary from person to person. They may include:  Runny nose.  Sneezing.  Nasal congestion.  Sinus irritation.  Sore throat.  Loss of voice (laryngitis).  Cough.  Fatigue.  Muscle aches.  Loss of appetite.  Headache.  Low-grade fever. DIAGNOSIS  You might diagnose your own cold based on familiar symptoms, since most people get a cold 2 to 3 times a year. Your caregiver can confirm this based on your exam. Most importantly, your caregiver can check that your symptoms are not due to another disease such as strep throat, sinusitis, pneumonia, asthma, or epiglottitis. Blood tests, throat tests, and X-rays are not necessary to diagnose a common cold, but they may sometimes be helpful in excluding other more serious diseases. Your caregiver will decide if any further tests are required. RISKS AND COMPLICATIONS  You may be at risk for a more severe case of the common cold if you smoke cigarettes, have chronic heart disease (such as heart failure) or lung disease (such as asthma), or if  you have a weakened immune system. The very young and very old are also at risk for more serious infections. Bacterial sinusitis, middle ear infections, and bacterial pneumonia can complicate the common cold. The common cold can worsen asthma and chronic obstructive pulmonary disease (COPD). Sometimes, these complications can require emergency medical care and may be life-threatening. PREVENTION  The best way to protect against getting a cold is to practice good hygiene. Avoid oral or hand contact with people with cold symptoms. Wash your hands often if contact occurs. There is no clear evidence that vitamin C, vitamin E, echinacea, or exercise reduces the chance of developing a cold. However, it is always recommended to get plenty of rest and practice good nutrition. TREATMENT  Treatment is directed at relieving symptoms. There is no cure. Antibiotics are not effective, because the infection is caused by a virus, not by bacteria. Treatment may include:  Increased fluid intake. Sports drinks offer valuable electrolytes, sugars, and fluids.  Breathing heated mist or steam (vaporizer or shower).  Eating chicken soup or other clear broths, and maintaining good nutrition.  Getting plenty of rest.  Using gargles or lozenges for comfort.  Controlling fevers with ibuprofen or acetaminophen as directed by your caregiver.  Increasing usage of your inhaler if you have asthma. Zinc gel and zinc lozenges, taken in the first 24 hours of the common cold, can shorten the duration and lessen the severity of symptoms. Pain medicines may help with fever, muscle aches, and throat pain. A variety of non-prescription medicines are available to treat congestion and runny nose. Your caregiver   can make recommendations and may suggest nasal or lung inhalers for other symptoms.  HOME CARE INSTRUCTIONS   Only take over-the-counter or prescription medicines for pain, discomfort, or fever as directed by your  caregiver.  Use a warm mist humidifier or inhale steam from a shower to increase air moisture. This may keep secretions moist and make it easier to breathe.  Drink enough water and fluids to keep your urine clear or pale yellow.  Rest as needed.  Return to work when your temperature has returned to normal or as your caregiver advises. You may need to stay home longer to avoid infecting others. You can also use a face mask and careful hand washing to prevent spread of the virus. SEEK MEDICAL CARE IF:   After the first few days, you feel you are getting worse rather than better.  You need your caregiver's advice about medicines to control symptoms.  You develop chills, worsening shortness of breath, or brown or red sputum. These may be signs of pneumonia.  You develop yellow or brown nasal discharge or pain in the face, especially when you bend forward. These may be signs of sinusitis.  You develop a fever, swollen neck glands, pain with swallowing, or white areas in the back of your throat. These may be signs of strep throat. SEEK IMMEDIATE MEDICAL CARE IF:   You have a fever.  You develop severe or persistent headache, ear pain, sinus pain, or chest pain.  You develop wheezing, a prolonged cough, cough up blood, or have a change in your usual mucus (if you have chronic lung disease).  You develop sore muscles or a stiff neck. Document Released: 04/14/2001 Document Revised: 01/11/2012 Document Reviewed: 01/24/2014 [employer] Patient Information 2015 [employer]. This information is not intended to replace advice given to you by your health care provider. Make sure you discuss any questions you have with your health care provider.  - Take meds as prescribed - Use a cool mist humidifier  -Use saline nose sprays frequently -Saline irrigations of the nose can be very helpful if done frequently.  * 4X daily for 1 week*  * Use of a nettie pot can be helpful with this. Follow  directions with this* -Force fluids -For any cough or congestion  Use plain Mucinex- regular strength or max strength is fine   * Children- consult with Pharmacist for dosing -For fever or aces or pains- take tylenol or ibuprofen appropriate for age and weight.  * for fevers greater than 101 orally you may alternate ibuprofen and tylenol every  3 hours. -Throat lozenges if help   Italo Banton, FNP   

## 2015-01-24 ENCOUNTER — Ambulatory Visit (INDEPENDENT_AMBULATORY_CARE_PROVIDER_SITE_OTHER): Payer: BLUE CROSS/BLUE SHIELD | Admitting: Family Medicine

## 2015-01-24 ENCOUNTER — Encounter: Payer: Self-pay | Admitting: Family Medicine

## 2015-01-24 VITALS — BP 156/86 | HR 90 | Temp 98.3°F | Ht 64.5 in | Wt 133.0 lb

## 2015-01-24 DIAGNOSIS — R030 Elevated blood-pressure reading, without diagnosis of hypertension: Secondary | ICD-10-CM

## 2015-01-24 DIAGNOSIS — J069 Acute upper respiratory infection, unspecified: Secondary | ICD-10-CM | POA: Diagnosis not present

## 2015-01-24 DIAGNOSIS — H109 Unspecified conjunctivitis: Secondary | ICD-10-CM | POA: Diagnosis not present

## 2015-01-24 DIAGNOSIS — IMO0001 Reserved for inherently not codable concepts without codable children: Secondary | ICD-10-CM

## 2015-01-24 MED ORDER — AMOXICILLIN 500 MG PO CAPS: 500.0000 mg | ORAL_CAPSULE | Freq: Three times a day (TID) | ORAL | Status: DC

## 2015-01-24 MED ORDER — SULFACETAMIDE SODIUM 10 % OP SOLN: 1.0000 [drp] | Freq: Four times a day (QID) | OPHTHALMIC | Status: DC

## 2015-01-24 NOTE — Patient Instructions (Signed)
Take antibiotic as directed Use eyedrops as directed and use these in both eyes for the first 2-3 days and finish up in the most affected eye for a total of 7-10 days Take Tylenol as needed for aches pains and fever Use nasal saline frequently during the day

## 2015-01-24 NOTE — Progress Notes (Signed)
   Subjective:    Patient ID: Amanda Legatoamela Sue Barker Hardy, female    DOB: 19-Oct-1961, 54 y.o.   MRN: [id number]  HPI Patient here today for left eye redness and drainage. This was first noticed yesterday. There was a lot of drainage this morning.        Patient Active Problem List   Diagnosis Date Noted  . Hypothyroidism 07/26/2013   Outpatient Encounter Prescriptions as of 01/24/2015  Medication Sig  . levothyroxine (SYNTHROID, LEVOTHROID) 75 MCG tablet TAKE ONE TABLET BY MOUTH ONCE DAILY  . Norgestimate-Ethinyl Estradiol Triphasic (ORTHO TRI-CYCLEN, 28,) 0.18/0.215/0.25 MG-35 MCG tablet Take 1 tablet by mouth daily.  . [DISCONTINUED] azithromycin (ZITHROMAX) 250 MG tablet Take 500 mg once, then 250 mg for four days    Review of Systems  Constitutional: Negative.   HENT: Positive for ear discharge (left eye with redness) and postnasal drip.   Eyes: Negative.   Respiratory: Negative.   Cardiovascular: Negative.   Gastrointestinal: Negative.   Endocrine: Negative.   Genitourinary: Negative.   Musculoskeletal: Negative.   Skin: Negative.   Allergic/Immunologic: Negative.   Neurological: Negative.   Hematological: Negative.   Psychiatric/Behavioral: Negative.        Objective:   Physical Exam  Constitutional: She is oriented to person, place, and time. She appears well-developed and well-nourished. No distress.  HENT:  Head: Normocephalic and atraumatic.  Right Ear: External ear normal.  Left Ear: External ear normal.  Mouth/Throat: Oropharynx is clear and moist. No oropharyngeal exudate.  Nasal congestion bilaterally  Eyes: EOM are normal. Pupils are equal, round, and reactive to light. Right eye exhibits no discharge. Left eye exhibits no discharge. No scleral icterus.  There is significant conjunctival redness in the left eye. The eye appears Somewhat swollen due to the redness.  Neck: Normal range of motion. No thyromegaly present.  Musculoskeletal: Normal range of  motion.  Lymphadenopathy:    She has no cervical adenopathy.  Neurological: She is alert and oriented to person, place, and time.  Skin: Skin is warm and dry. No rash noted.  Psychiatric: She has a normal mood and affect. Her behavior is normal. Thought content normal.  Nursing note and vitals reviewed.  BP 156/86 mmHg  Pulse 90  Temp(Src) 98.3 F (36.8 C) (Oral)  Ht 5' 4.5" (1.638 m)  Wt 133 lb (60.328 kg)  BMI 22.48 kg/m2  LMP 12/26/2014        Assessment & Plan:  1. URI (upper respiratory infection) -Use nasal saline  2. Conjunctivitis of left eye -Use eyedrops as directed  3. Elevated blood pressure -Watch sodium intake  Meds ordered this encounter  Medications  . sulfacetamide (BLEPH-10) 10 % ophthalmic solution    Sig: Place 1 drop into both eyes 4 (four) times daily.    Dispense:  15 mL    Refill:  0  . amoxicillin (AMOXIL) 500 MG capsule    Sig: Take 1 capsule (500 mg total) by mouth 3 (three) times daily.    Dispense:  30 capsule    Refill:  0     Patient Instructions  Take antibiotic as directed Use eyedrops as directed and use these in both eyes for the first 2-3 days and finish up in the most affected eye for a total of 7-10 days Take Tylenol as needed for aches pains and fever Use nasal saline frequently during the day    Nyra Capeson W. Najeh Credit MD

## 2015-02-26 ENCOUNTER — Other Ambulatory Visit: Payer: Self-pay | Admitting: Nurse Practitioner

## 2015-03-29 ENCOUNTER — Ambulatory Visit (INDEPENDENT_AMBULATORY_CARE_PROVIDER_SITE_OTHER): Payer: BLUE CROSS/BLUE SHIELD

## 2015-03-29 ENCOUNTER — Ambulatory Visit (INDEPENDENT_AMBULATORY_CARE_PROVIDER_SITE_OTHER): Payer: BLUE CROSS/BLUE SHIELD | Admitting: Family

## 2015-03-29 ENCOUNTER — Encounter: Payer: Self-pay | Admitting: Family

## 2015-03-29 VITALS — BP 140/75 | HR 83 | Temp 98.0°F | Ht 64.5 in | Wt 134.0 lb

## 2015-03-29 DIAGNOSIS — M542 Cervicalgia: Secondary | ICD-10-CM

## 2015-03-29 DIAGNOSIS — M5412 Radiculopathy, cervical region: Secondary | ICD-10-CM

## 2015-03-29 MED ORDER — CYCLOBENZAPRINE HCL 10 MG PO TABS: 10.0000 mg | ORAL_TABLET | Freq: Three times a day (TID) | ORAL | Status: DC | PRN

## 2015-03-29 MED ORDER — METHYLPREDNISOLONE ACETATE 80 MG/ML IJ SUSP: 80.0000 mg | Freq: Once | INTRAMUSCULAR | Status: DC

## 2015-03-29 MED ORDER — MELOXICAM 15 MG PO TABS: 15.0000 mg | ORAL_TABLET | Freq: Every day | ORAL | Status: DC

## 2015-03-29 MED ORDER — KETOROLAC TROMETHAMINE 60 MG/2ML IM SOLN: 60.0000 mg | Freq: Once | INTRAMUSCULAR | Status: AC

## 2015-03-29 NOTE — Progress Notes (Signed)
   Subjective:    Patient ID: Amanda Hardy, female    DOB: 09/25/1961, 54 y.o.   MRN: [id number]  Neck Pain  This is a new problem. The current episode started in the past 7 days (Tuesday). The problem occurs constantly. The problem has been unchanged. The pain is associated with a fall. The pain is present in the left side. The quality of the pain is described as aching. The pain is at a severity of 5/10. The pain is moderate. The symptoms are aggravated by position. Associated symptoms include numbness (Left shoulder and arm) and tingling (left arm and shoulder). Pertinent negatives include no fever, headaches, leg pain or visual change. She has tried acetaminophen and NSAIDs for the symptoms. The treatment provided mild relief.      Review of Systems  Constitutional: Negative.  Negative for fever.  HENT: Negative.   Eyes: Negative.   Respiratory: Negative.  Negative for shortness of breath.   Cardiovascular: Negative.  Negative for palpitations.  Gastrointestinal: Negative.   Endocrine: Negative.   Genitourinary: Negative.   Musculoskeletal: Positive for neck pain.  Neurological: Positive for tingling (left arm and shoulder) and numbness (Left shoulder and arm). Negative for headaches.  Hematological: Negative.   Psychiatric/Behavioral: Negative.   All other systems reviewed and are negative.      Objective:   Physical Exam  Constitutional: She is oriented to person, place, and time. She appears well-developed and well-nourished. No distress.  HENT:  Head: Normocephalic and atraumatic.  Eyes: Pupils are equal, round, and reactive to light.  Neck: Normal range of motion. Neck supple. No thyromegaly present.  Cardiovascular: Normal rate, regular rhythm, normal heart sounds and intact distal pulses.   No murmur heard. Pulmonary/Chest: Effort normal and breath sounds normal. No respiratory distress. She has no wheezes.  Abdominal: Soft. Bowel sounds are normal. She  exhibits no distension. There is no tenderness.  Musculoskeletal: Normal range of motion. She exhibits no edema or tenderness.  Neurological: She is alert and oriented to person, place, and time. She has normal reflexes. No cranial nerve deficit.  Skin: Skin is warm and dry.  Psychiatric: She has a normal mood and affect. Her behavior is normal. Judgment and thought content normal.  Vitals reviewed.   BP 140/75 mmHg  Pulse 83  Temp(Src) 98 F (36.7 C) (Oral)  Ht 5' 4.5" (1.638 m)  Wt 134 lb (60.782 kg)  BMI 22.65 kg/m2  Cervical x-ray-WNL Preliminary reading by Evelina Dun, FNP [facility]      Assessment & Plan:  1. Neck pai - DG Cervical Spine Complete; Future  2. Cervical radiculitis -Rest -Ice Sedation precaution discussed No other NSAID's RTO prn - ketorolac (TORADOL) injection 60 mg; Inject 2 mLs (60 mg total) into the muscle once. - methylPREDNISolone acetate (DEPO-MEDROL) injection 80 mg; Inject 1 mL (80 mg total) into the muscle once. - cyclobenzaprine (FLEXERIL) 10 MG tablet; Take 1 tablet (10 mg total) by mouth 3 (three) times daily as needed for muscle spasms.  Dispense: 30 tablet; Refill: 0 - meloxicam (MOBIC) 15 MG tablet; Take 1 tablet (15 mg total) by mouth daily.  Dispense: 30 tablet; Refill: 0 - BMP8+EGFR  Evelina Dun, FNP

## 2015-03-29 NOTE — Patient Instructions (Signed)

## 2015-06-13 ENCOUNTER — Encounter: Payer: Self-pay | Admitting: Family Medicine

## 2015-06-13 ENCOUNTER — Ambulatory Visit (INDEPENDENT_AMBULATORY_CARE_PROVIDER_SITE_OTHER): Payer: BLUE CROSS/BLUE SHIELD | Admitting: Family Medicine

## 2015-06-13 VITALS — BP 138/86 | HR 90 | Temp 98.8°F | Ht 64.5 in | Wt 134.8 lb

## 2015-06-13 DIAGNOSIS — E038 Other specified hypothyroidism: Secondary | ICD-10-CM | POA: Diagnosis not present

## 2015-06-13 DIAGNOSIS — E785 Hyperlipidemia, unspecified: Secondary | ICD-10-CM

## 2015-06-13 NOTE — Progress Notes (Signed)
BP 138/86 mmHg  Pulse 90  Temp(Src) 98.8 F (37.1 C) (Oral)  Ht 5' 4.5" (1.638 m)  Wt 134 lb 12.8 oz (61.145 kg)  BMI 22.79 kg/m2  LMP 06/02/2015   Subjective:    Patient ID: Amanda Hardy, female    DOB: [birth year], 54 y.o.   MRN: [id number]  HPI: Amanda Hardy is a 54 y.o. female presenting on 06/13/2015 for Labwork   HPI Hypothyroidism Patient presents today for recheck of her thyroid. She feels that she is having increased hair loss, weight gain, decreased energy and thinning skin. The last time she checked was in October 2015, and at that time her TSH was 3.24 and her free T4 was 2.0 and within the normal range. She has been on her dose of levothyroxine 75 g for at least a few years. the symptoms that she's been having have been coming up for the past couple months.  Hyperlipidemia Patient was diagnosed with hyperlipidemia in October 2015 when her total cholesterol was 233,er LDL was 150, her triglycerides were 171, and her HDL was 49. She had the discussion at that point about diet and exercise and opted not to go on a medication at that point. She has not had retested since that time. Denies chest pains or shortness of breath.  Relevant past medical, surgical, family and social history reviewed and updated as indicated. Interim medical history since our last visit reviewed. Allergies and medications reviewed and updated.  Review of Systems  Constitutional: Negative for fever and chills.  HENT: Negative for congestion, ear discharge and ear pain.   Eyes: Negative for redness and visual disturbance.  Respiratory: Negative for chest tightness and shortness of breath.   Cardiovascular: Negative for chest pain, palpitations and leg swelling.  Gastrointestinal: Negative for abdominal pain, diarrhea and constipation.  Endocrine: Positive for cold intolerance. Negative for heat intolerance.  Genitourinary: Negative for dysuria and difficulty urinating.    Musculoskeletal: Negative for back pain and gait problem.  Skin: Negative for rash.  Neurological: Negative for dizziness, light-headedness and headaches.  Psychiatric/Behavioral: Negative for behavioral problems and agitation.  All other systems reviewed and are negative.   Per HPI unless specifically indicated above     Medication List       This list is accurate as of: 06/13/15  9:39 PM.  Always use your most recent med list.               levothyroxine 75 MCG tablet  Commonly known as:  SYNTHROID, LEVOTHROID  TAKE ONE TABLET BY MOUTH ONCE DAILY     TRI-SPRINTEC 0.18/0.215/0.25 MG-35 MCG tablet  Generic drug:  Norgestimate-Ethinyl Estradiol Triphasic  TAKE ONE TABLET BY MOUTH ONCE DAILY           Objective:    BP 138/86 mmHg  Pulse 90  Temp(Src) 98.8 F (37.1 C) (Oral)  Ht 5' 4.5" (1.638 m)  Wt 134 lb 12.8 oz (61.145 kg)  BMI 22.79 kg/m2  LMP 06/02/2015  Wt Readings from Last 3 Encounters:  06/13/15 134 lb 12.8 oz (61.145 kg)  03/29/15 134 lb (60.782 kg)  01/24/15 133 lb (60.328 kg)    Physical Exam  Constitutional: She is oriented to person, place, and time. She appears well-developed and well-nourished. No distress.  HENT:  Head: Hair is abnormal (thin hair).  Eyes: Conjunctivae and EOM are normal. Pupils are equal, round, and reactive to light.  Neck: Normal range of motion. Neck supple. No tracheal deviation  present. No thyromegaly present.  Cardiovascular: Normal rate and regular rhythm.   No murmur heard. Pulmonary/Chest: Effort normal and breath sounds normal. No respiratory distress. She has no wheezes.  Abdominal: Soft. Bowel sounds are normal. She exhibits no distension. There is no tenderness.  Musculoskeletal: Normal range of motion. She exhibits no edema or tenderness.  Lymphadenopathy:    She has no cervical adenopathy.  Neurological: She is alert and oriented to person, place, and time. Coordination normal.  Skin: Skin is warm and dry.  No rash noted. She is not diaphoretic.  Psychiatric: She has a normal mood and affect. Her behavior is normal.  Vitals reviewed.   Results for orders placed or performed in visit on 08/16/14  CMP14+EGFR  Result Value Ref Range   Glucose 103 (H) 65 - 99 mg/dL   BUN 13 6 - 24 mg/dL   Creatinine, Ser 0.78 0.57 - 1.00 mg/dL   GFR calc non Af Amer 86 >59 mL/min/1.73   GFR calc Af Amer 100 >59 mL/min/1.73   BUN/Creatinine Ratio 17 9 - 23   Sodium 141 134 - 144 mmol/L   Potassium 3.7 3.5 - 5.2 mmol/L   Chloride 102 97 - 108 mmol/L   CO2 20 18 - 29 mmol/L   Calcium 9.6 8.7 - 10.2 mg/dL   Total Protein 7.7 6.0 - 8.5 g/dL   Albumin 4.6 3.5 - 5.5 g/dL   Globulin, Total 3.1 1.5 - 4.5 g/dL   Albumin/Globulin Ratio 1.5 1.1 - 2.5   Total Bilirubin <0.2 0.0 - 1.2 mg/dL   Alkaline Phosphatase 45 39 - 117 IU/L   AST 14 0 - 40 IU/L   ALT 8 0 - 32 IU/L  NMR, lipoprofile  Result Value Ref Range   LDL Particle Number 1952 (H) <1000 nmol/L   LDLC SERPL CALC-MCNC 150 (H) 0 - 99 mg/dL   HDL Cholesterol by NMR 49 >39 mg/dL   Triglycerides by NMR 171 (H) 0 - 149 mg/dL   Cholesterol 233 (H) 100 - 199 mg/dL   HDL Particle Number 42.2 >=30.5 umol/L   Small LDL Particle Number 803 (H) <=527 nmol/L   LDL Size 20.8 >20.5 nm   LP-IR Score 30 <=45  Thyroid Panel With TSH  Result Value Ref Range   TSH 3.240 0.450 - 4.500 uIU/mL   T4, Total 9.9 4.5 - 12.0 ug/dL   T3 Uptake Ratio 20 (L) 24 - 39 %   Free Thyroxine Index 2.0 1.2 - 4.9      Assessment & Plan:   Problem List Items Addressed This Visit      Endocrine   Hypothyroidism - Primary   Relevant Orders   Thyroid Panel With TSH     Other   Hyperlipidemia LDL goal <130   Relevant Orders   Lipid panel       Follow up plan: Return in about 6 months (around 12/14/2015), or if symptoms worsen or fail to improve.  Caryl Pina, MD [facility] 06/13/2015, 9:39 PM

## 2015-06-13 NOTE — Patient Instructions (Signed)

## 2015-06-14 LAB — THYROID PANEL WITH TSH
Free Thyroxine Index: 1.9 (ref 1.2–4.9)
T3 UPTAKE RATIO: 20 % — AB (ref 24–39)
T4 TOTAL: 9.4 ug/dL (ref 4.5–12.0)
TSH: 1.19 u[IU]/mL (ref 0.450–4.500)

## 2015-06-14 LAB — LIPID PANEL
Chol/HDL Ratio: 3.9 ratio units (ref 0.0–4.4)
Cholesterol, Total: 217 mg/dL — ABNORMAL HIGH (ref 100–199)
HDL: 55 mg/dL (ref >39)
LDL CALC: 120 mg/dL — AB (ref 0–99)
Triglycerides: 210 mg/dL — ABNORMAL HIGH (ref 0–149)
VLDL Cholesterol Cal: 42 mg/dL — ABNORMAL HIGH (ref 5–40)

## 2015-06-14 MED ORDER — ROSUVASTATIN CALCIUM 20 MG PO TABS: 20.0000 mg | ORAL_TABLET | Freq: Every day | ORAL | Status: DC

## 2015-06-14 NOTE — Addendum Note (Signed)
Addended by: Arville Care on: 06/14/2015 08:57 AM   Modules accepted: Orders

## 2015-06-14 NOTE — Progress Notes (Signed)
Patient aware of results and crestor rx.

## 2015-10-06 ENCOUNTER — Other Ambulatory Visit: Payer: Self-pay | Admitting: Nurse Practitioner

## 2015-10-08 ENCOUNTER — Ambulatory Visit (INDEPENDENT_AMBULATORY_CARE_PROVIDER_SITE_OTHER): Payer: BLUE CROSS/BLUE SHIELD

## 2015-10-08 DIAGNOSIS — Z23 Encounter for immunization: Secondary | ICD-10-CM | POA: Diagnosis not present

## 2015-12-02 ENCOUNTER — Ambulatory Visit (INDEPENDENT_AMBULATORY_CARE_PROVIDER_SITE_OTHER): Payer: BLUE CROSS/BLUE SHIELD | Admitting: Family Medicine

## 2015-12-02 ENCOUNTER — Encounter: Payer: Self-pay | Admitting: Family Medicine

## 2015-12-02 VITALS — BP 132/88 | HR 90 | Temp 98.7°F | Ht 64.5 in | Wt 131.8 lb

## 2015-12-02 DIAGNOSIS — G5691 Unspecified mononeuropathy of right upper limb: Secondary | ICD-10-CM | POA: Diagnosis not present

## 2015-12-02 DIAGNOSIS — M542 Cervicalgia: Secondary | ICD-10-CM | POA: Diagnosis not present

## 2015-12-02 MED ORDER — PREDNISONE 20 MG PO TABS: ORAL_TABLET | ORAL | Status: DC
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
## 2015-12-05 ENCOUNTER — Ambulatory Visit (INDEPENDENT_AMBULATORY_CARE_PROVIDER_SITE_OTHER): Payer: BLUE CROSS/BLUE SHIELD | Admitting: Neurology

## 2015-12-05 ENCOUNTER — Encounter: Payer: Self-pay | Admitting: Neurology

## 2015-12-05 VITALS — BP 148/90 | HR 96 | Ht 64.5 in | Wt 132.1 lb

## 2015-12-05 DIAGNOSIS — R292 Abnormal reflex: Secondary | ICD-10-CM

## 2015-12-05 DIAGNOSIS — M542 Cervicalgia: Secondary | ICD-10-CM | POA: Diagnosis not present

## 2015-12-05 DIAGNOSIS — R202 Paresthesia of skin: Secondary | ICD-10-CM

## 2015-12-05 DIAGNOSIS — M5126 Other intervertebral disc displacement, lumbar region: Secondary | ICD-10-CM | POA: Diagnosis not present
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
## 2015-12-05 NOTE — Patient Instructions (Addendum)
MRI lumbar spine without contrast   We will call you with the results  

## 2015-12-11 ENCOUNTER — Inpatient Hospital Stay: Admission: RE | Admit: 2015-12-11 | Payer: BLUE CROSS/BLUE SHIELD | Source: Ambulatory Visit

## 2015-12-11 ENCOUNTER — Telehealth: Payer: Self-pay | Admitting: *Deleted

## 2015-12-11 NOTE — Telephone Encounter (Signed)
I called patient and informed her that her MRI has been denied by her insurance and that she would have to try PT first.  Patient agreed.  MRI cancelled at [facility].

## 2015-12-13 ENCOUNTER — Telehealth: Payer: Self-pay | Admitting: Family Medicine

## 2016-07-09 ENCOUNTER — Encounter: Payer: Self-pay | Admitting: Pediatrics

## 2016-07-09 ENCOUNTER — Ambulatory Visit (INDEPENDENT_AMBULATORY_CARE_PROVIDER_SITE_OTHER): Payer: BLUE CROSS/BLUE SHIELD | Admitting: Pediatrics

## 2016-07-09 ENCOUNTER — Other Ambulatory Visit: Payer: Self-pay | Admitting: Family Medicine

## 2016-07-09 VITALS — BP 132/79 | HR 81 | Temp 98.5°F | Ht 64.5 in | Wt 133.8 lb

## 2016-07-09 DIAGNOSIS — Z1211 Encounter for screening for malignant neoplasm of colon: Secondary | ICD-10-CM

## 2016-07-09 DIAGNOSIS — E038 Other specified hypothyroidism: Secondary | ICD-10-CM

## 2016-07-09 DIAGNOSIS — R739 Hyperglycemia, unspecified: Secondary | ICD-10-CM | POA: Diagnosis not present

## 2016-07-09 DIAGNOSIS — E034 Atrophy of thyroid (acquired): Secondary | ICD-10-CM | POA: Diagnosis not present

## 2016-07-09 DIAGNOSIS — G4700 Insomnia, unspecified: Secondary | ICD-10-CM

## 2016-07-09 LAB — BAYER DCA HB A1C WAIVED: HB A1C: 5.8 % (ref <7.0)

## 2016-07-09 NOTE — Progress Notes (Signed)
  Subjective:   Patient ID: Amanda Hardy, female    DOB: Mar 25, 1961, 55 y.o.   MRN: [id number] CC: Follow-up hypothyroidism  HPI: Amanda Hardy is a 55 y.o. female presenting for Follow-up  Same dose of levothyroxine, unchanged for a while per pt No symptoms now Feeling well  Stopped OCP last year Trouble sleeping since then, though says she has always had some trouble sleeping  Has mammogram scheduled, upcoming Due for pap smear  Fam hx: dad's sister breast ca No colon ca in the family Does not want a colonoscopy  Relevant past medical, surgical, family and social history reviewed. Allergies and medications reviewed and updated. History  Smoking Status  . Former Smoker  Smokeless Tobacco  . Never Used   ROS: Per HPI   Objective:    BP 132/79   Pulse 81   Temp 98.5 F (36.9 C) (Oral)   Ht 5' 4.5" (1.638 m)   Wt 133 lb 12.8 oz (60.7 kg)   BMI 22.61 kg/m   Wt Readings from Last 3 Encounters:  07/09/16 133 lb 12.8 oz (60.7 kg)  12/05/15 132 lb 1 oz (59.9 kg)  12/02/15 131 lb 12.8 oz (59.8 kg)    Gen: NAD, alert, cooperative with exam, NCAT EYES: EOMI, no conjunctival injection, or no icterus ENT:  TMs pearly gray b/l, OP without erythema LYMPH: no cervical LAD CV: NRRR, normal S1/S2, no murmur, distal pulses 2+ b/l Resp: CTABL, no wheezes, normal WOB Abd: +BS, soft, NTND. no guarding or organomegaly Ext: No edema, warm Neuro: Alert and oriented, strength equal b/l UE and LE, coordination grossly normal MSK: normal muscle bulk  Assessment & Plan:  Amanda Hardy was seen today for follow-up med problems.  Diagnoses and all orders for this visit:  Hyperglycemia Present on prior lab results -     Bayer DCA Hb A1c Waived  Hypothyroidism due to acquired atrophy of thyroid Cont levothyroxine, labs today -     TSH  Insomnia Ongoing problem Discussed sleep hygiene  Colon cancer screening Pt hesitant about colonoscopy Thinking about fecal  occult cards Will let me know  Follow up plan: Return in about 4 weeks (around 08/06/2016) for pap smear, well exam. Rex Krasarol Ashni Lonzo, MD [facility]

## 2016-07-10 LAB — TSH: TSH: 1.35 u[IU]/mL (ref 0.450–4.500)

## 2016-07-15 ENCOUNTER — Telehealth: Payer: Self-pay | Admitting: Family Medicine

## 2016-07-27 ENCOUNTER — Encounter: Payer: BLUE CROSS/BLUE SHIELD | Admitting: *Deleted

## 2016-07-27 DIAGNOSIS — Z1231 Encounter for screening mammogram for malignant neoplasm of breast: Secondary | ICD-10-CM | POA: Diagnosis not present

## 2016-08-05 ENCOUNTER — Ambulatory Visit (INDEPENDENT_AMBULATORY_CARE_PROVIDER_SITE_OTHER): Payer: BLUE CROSS/BLUE SHIELD | Admitting: Pediatrics

## 2016-08-05 ENCOUNTER — Ambulatory Visit (INDEPENDENT_AMBULATORY_CARE_PROVIDER_SITE_OTHER): Payer: BLUE CROSS/BLUE SHIELD

## 2016-08-05 ENCOUNTER — Encounter: Payer: Self-pay | Admitting: Pediatrics

## 2016-08-05 VITALS — BP 116/74 | HR 69 | Temp 98.7°F | Ht 64.5 in | Wt 131.8 lb

## 2016-08-05 DIAGNOSIS — M25531 Pain in right wrist: Secondary | ICD-10-CM

## 2016-08-05 NOTE — Patient Instructions (Signed)
Take naproxen twice a day Keep wrist in splint Xray today

## 2016-08-05 NOTE — Progress Notes (Signed)
  Subjective:   Patient ID: Amanda Hardy, female    DOB: 1961-10-15, 55 y.o.   MRN: [id number] CC: Wrist Pain (right 1-2 years)  HPI: Amanda Hardy is a 55 y.o. female presenting for Wrist Pain (right 1-2 years)  Wrist pain, R side Has been aching and hurting for a while Used to hammer and dig a lot Playing foos ball had a  Wrist sometimes feels hot Doesn't look swollen, sometimes feels tight Present all the time Notices it regularly with normal activities   Relevant past medical, surgical, family and social history reviewed. Allergies and medications reviewed and updated. History  Smoking Status  . Former Smoker  Smokeless Tobacco  . Never Used   ROS: Per HPI   Objective:    BP 116/74   Pulse 69   Temp 98.7 F (37.1 C) (Oral)   Ht 5' 4.5" (1.638 m)   Wt 131 lb 12.8 oz (59.8 kg)   BMI 22.27 kg/m   Wt Readings from Last 3 Encounters:  08/05/16 131 lb 12.8 oz (59.8 kg)  07/09/16 133 lb 12.8 oz (60.7 kg)  12/05/15 132 lb 1 oz (59.9 kg)    Gen: NAD, alert, cooperative with exam, NCAT EYES: EOMI, no conjunctival injection, or no icterus CV: distal pulses 2+ b/l Resp: normal WOB Neuro: Alert and oriented, sensation intact b/l fingers MSK: R Wrist: Inspection normal with no visible erythema or swelling. ROM smooth with good flexion and extension, pain with ext against resistance in extensor surface of forearm going up halfway lower arm, normal ulnar/radial deviation that is symmetrical with opposite wrist.  Palpation is normal over metacarpals, navicular, lunate, and TFCC; tendons without tenderness/ swelling Strength 5/5 in all directions without pain.  Assessment & Plan:  Amanda Hardy was seen today for wrist pain. Xray final read with with degenerative changes "Moderate degenerative change at the base of the first metacarpal." Discussed NSAID use Use wrist splint if helps  Diagnoses and all orders for this visit:  Right wrist pain -     DG  Wrist Complete Right; Future   Follow up plan: Return in about 4 weeks (around 09/02/2016), or if symptoms worsen or fail to improve. Amanda Krasarol Nyshaun Standage, MD [facility]

## 2016-11-11 ENCOUNTER — Ambulatory Visit (INDEPENDENT_AMBULATORY_CARE_PROVIDER_SITE_OTHER): Payer: BLUE CROSS/BLUE SHIELD | Admitting: Family Medicine

## 2016-11-11 VITALS — BP 122/73 | HR 93 | Temp 97.1°F | Ht 64.5 in | Wt 132.4 lb

## 2016-11-11 DIAGNOSIS — R58 Hemorrhage, not elsewhere classified: Secondary | ICD-10-CM

## 2016-11-11 NOTE — Progress Notes (Signed)
   Subjective:  Patient ID: Amanda Hardy, female    DOB: Oct 24, 1961  Age: 56 y.o. MRN: [id number]  CC: Skin Discoloration (pt here today c/o a spot that she noticed on her left forearm)   HPI Amanda Legatoamela Sue Barker Manning presents for Lesion popped up on left forearm 2 days ago. There was nothing there before. There is no known injury. Her mother's had several spots removed. She is worried about skin cancer. It is not painful.  History [name] has a past medical history of Cervical disc disease; Ruptured lumbar disc; and Thyroid disease.   She has a past surgical history that includes Dilation and curettage of uterus.   Her family history includes Cancer in her father; Dementia in her father; Healthy in her daughter; Hypertension in her mother; Thyroid disease in her brother, mother, and sister.She reports that she has quit smoking. She has never used smokeless tobacco. She reports that she does not drink alcohol or use drugs.  Current Outpatient Prescriptions on File Prior to Visit  Medication Sig Dispense Refill  . BIOTIN PO Take by mouth.    . Docosahexaenoic Acid (DHA PO) Take by mouth.    . Garlic 1000 MG CAPS Take by mouth.    . levothyroxine (SYNTHROID, LEVOTHROID) 75 MCG tablet TAKE ONE TABLET BY MOUTH ONCE DAILY 90 tablet 2   No current facility-administered medications on file prior to visit.     ROS Review of Systems  Noncontributory except as per history of present illness  Objective:  BP 122/73   Pulse 93   Temp 97.1 F (36.2 C) (Oral)   Ht 5' 4.5" (1.638 m)   Wt 132 lb 6 oz (60 kg)   BMI 22.37 kg/m   Physical Exam Left forearm is noted to be pale, light complected. The exception is an 8 mm circumscribed blue-black lesion with a reddish border. This is not raised there is no scale. No bilirubin/biliverdin coloration surrounding.  Assessment & Plan:   [name] was seen today for skin discoloration.  Diagnoses and all orders for this visit:  Ecchymosis  of forearm   We discussed outside possibility of this being neoplastic. However, the presentation and overall appearance do not appear consistent. We'll monitor it for one week and if it begins to fade during that time she should be free of aureus for this. However if it does not begin to fade and excisional biopsy might be useful. Short term observation seems appropriate.   Follow-up: Return if symptoms worsen or fail to improve.  Mechele ClaudeWarren Nickolus Wadding, M.D.

## 2017-04-07 ENCOUNTER — Ambulatory Visit (INDEPENDENT_AMBULATORY_CARE_PROVIDER_SITE_OTHER): Payer: BLUE CROSS/BLUE SHIELD | Admitting: Pediatrics

## 2017-04-07 ENCOUNTER — Encounter (INDEPENDENT_AMBULATORY_CARE_PROVIDER_SITE_OTHER): Payer: Self-pay

## 2017-04-07 ENCOUNTER — Encounter: Payer: Self-pay | Admitting: Pediatrics

## 2017-04-07 VITALS — BP 128/81 | HR 82 | Temp 97.8°F | Ht 64.5 in | Wt 131.8 lb

## 2017-04-07 DIAGNOSIS — J3089 Other allergic rhinitis: Secondary | ICD-10-CM | POA: Diagnosis not present

## 2017-04-07 DIAGNOSIS — E034 Atrophy of thyroid (acquired): Secondary | ICD-10-CM

## 2017-04-07 NOTE — Progress Notes (Signed)
  Subjective:   Patient ID: Amanda Hardy, female    DOB: 04-11-1961, 56 y.o.   MRN: [id number] CC: Follow-up (Thyroid)  HPI: Amanda Hardy is a 56 y.o. female presenting for Follow-up (Thyroid)  Has been taking glucosamine for bones and joints Bothered in hands and feet mostly No swelling Has been feeling well No fevers No hair loss, no skin changes No heart palpitations Takes thyroid medication every day 5 am two hours before eating anything  Has had some scratchy throat Doesn't want to take flonase, bothered with things up her nose  Relevant past medical, surgical, family and social history reviewed. Allergies and medications reviewed and updated. History  Smoking Status  . Former Smoker  Smokeless Tobacco  . Never Used   ROS: Per HPI   Objective:    BP 128/81   Pulse 82   Temp 97.8 F (36.6 C) (Oral)   Ht 5' 4.5" (1.638 m)   Wt 131 lb 12.8 oz (59.8 kg)   BMI 22.27 kg/m   Wt Readings from Last 3 Encounters:  04/07/17 131 lb 12.8 oz (59.8 kg)  11/11/16 132 lb 6 oz (60 kg)  08/05/16 131 lb 12.8 oz (59.8 kg)    Gen: NAD, alert, cooperative with exam, NCAT EYES: EOMI, no conjunctival injection, or no icterus ENT:  TMs pearly gray b/l LYMPH: no cervical LAD CV: NRRR, normal S1/S2, no murmur, distal pulses 2+ b/l Resp: CTABL, no wheezes, normal WOB Ext: No edema, warm Neuro: Alert and oriented  Assessment & Plan:  Amanda Hardy was seen today for follow-up.  Diagnoses and all orders for this visit:  Hypothyroidism due to acquired atrophy of thyroid Stable cont med, recheck TSH -     TSH  Allergic rhinitis due to other allergic trigger, unspecified seasonality Can use zyrtec as needed  Follow up plan: 2 mo for pap smear Rex Krasarol Vincent, MD [facility]

## 2017-04-08 LAB — TSH: TSH: 0.933 u[IU]/mL (ref 0.450–4.500)

## 2017-04-09 ENCOUNTER — Other Ambulatory Visit: Payer: Self-pay | Admitting: Pediatrics

## 2017-04-30 ENCOUNTER — Ambulatory Visit (INDEPENDENT_AMBULATORY_CARE_PROVIDER_SITE_OTHER): Payer: BLUE CROSS/BLUE SHIELD | Admitting: Family

## 2017-04-30 ENCOUNTER — Encounter: Payer: Self-pay | Admitting: Family

## 2017-04-30 VITALS — BP 129/83 | HR 75 | Temp 98.3°F | Ht 64.5 in | Wt 131.0 lb

## 2017-04-30 DIAGNOSIS — M546 Pain in thoracic spine: Secondary | ICD-10-CM | POA: Diagnosis not present

## 2017-04-30 DIAGNOSIS — G8929 Other chronic pain: Secondary | ICD-10-CM | POA: Diagnosis not present

## 2017-04-30 MED ORDER — CYCLOBENZAPRINE HCL 5 MG PO TABS: 5.0000 mg | ORAL_TABLET | Freq: Three times a day (TID) | ORAL | 2 refills | Status: DC | PRN

## 2017-04-30 MED ORDER — NAPROXEN 500 MG PO TABS: 500.0000 mg | ORAL_TABLET | Freq: Two times a day (BID) | ORAL | 1 refills | Status: DC

## 2017-04-30 NOTE — Progress Notes (Signed)
   Subjective:    Patient ID: Amanda Hardy, female    DOB: 1960/11/24, 56 y.o.   MRN: [id number]  Pt presents to the office today with recurrent back pain. Pt states two weeks ago she mowed the yard and believes she "flared it up".  Back Pain  This is a recurrent problem. The current episode started 1 to 4 weeks ago. The problem occurs intermittently. The problem has been waxing and waning since onset. The pain is present in the thoracic spine. The quality of the pain is described as aching. The pain does not radiate. The pain is at a severity of 8/10. The pain is moderate. The symptoms are aggravated by bending and sitting. Stiffness is present in the morning. Associated symptoms include leg pain and tingling. Pertinent negatives include no bladder incontinence, bowel incontinence or dysuria. She has tried NSAIDs for the symptoms. The treatment provided mild relief.      Review of Systems  Gastrointestinal: Negative for bowel incontinence.  Genitourinary: Negative for bladder incontinence and dysuria.  Musculoskeletal: Positive for back pain.  Neurological: Positive for tingling.  All other systems reviewed and are negative.      Objective:   Physical Exam  Constitutional: She is oriented to person, place, and time. She appears well-developed and well-nourished. No distress.  HENT:  Head: Normocephalic.  Eyes: Pupils are equal, round, and reactive to light.  Neck: Normal range of motion. Neck supple. No thyromegaly present.  Cardiovascular: Normal rate, regular rhythm, normal heart sounds and intact distal pulses.   No murmur heard. Pulmonary/Chest: Effort normal and breath sounds normal. No respiratory distress. She has no wheezes.  Abdominal: Soft. Bowel sounds are normal. She exhibits no distension. There is no tenderness.  Musculoskeletal: Normal range of motion. She exhibits tenderness. She exhibits no edema.  Mild Bilateral tenderness in thoracic area with extension  and rotation   Neurological: She is alert and oriented to person, place, and time.  Skin: Skin is warm and dry.  Psychiatric: She has a normal mood and affect. Her behavior is normal. Judgment and thought content normal.  Vitals reviewed.     BP 129/83   Pulse 75   Temp 98.3 F (36.8 C) (Oral)   Ht 5' 4.5" (1.638 m)   Wt 131 lb (59.4 kg)   LMP 08/01/2015   BMI 22.14 kg/m      Assessment & Plan:  1. Chronic bilateral thoracic back pain Rest Ice and heat as needed ROM exercises discussed- Handout given  RTO prn  - cyclobenzaprine (FLEXERIL) 5 MG tablet; Take 1 tablet (5 mg total) by mouth 3 (three) times daily as needed for muscle spasms.  Dispense: 60 tablet; Refill: 2 - naproxen (NAPROSYN) 500 MG tablet; Take 1 tablet (500 mg total) by mouth 2 (two) times daily with a meal.  Dispense: 60 tablet; Refill: 1   Jannifer Rodneyhristy Bennie Chirico, FNP

## 2017-04-30 NOTE — Patient Instructions (Signed)
Mid-Back Strain Rehab Ask your health care provider which exercises are safe for you. Do exercises exactly as told by your health care provider and adjust them as directed. It is normal to feel mild stretching, pulling, tightness, or discomfort as you do these exercises, but you should stop right away if you feel sudden pain or your pain gets worse. Do not begin these exercises until told by your health care provider. Stretching and range of motion exercises This exercise warms up your muscles and joints and improves the movement and flexibility of your back and shoulders. This exercise also help to relieve pain. Exercise A: Chest and spine stretch  1. Lie down on your back on a firm surface. 2. Roll a towel or a small blanket so it is about 4 inches (10 cm) in diameter. 3. Put the towel lengthwise under the middle of your back so it is under your spine, but not under your shoulder blades. 4. To increase the stretch, you may put your hands behind your head and let your elbows fall to your sides. 5. Hold for __________ seconds. Repeat exercise __________ times. Complete this exercise __________ times a day. Strengthening exercises These exercises build strength and endurance in your back and your shoulder blade muscles. Endurance is the ability to use your muscles for a long time, even after they get tired. Exercise B: Alternating arm and leg raises  1. Get on your hands and knees on a firm surface. If you are on a hard floor, you may want to use padding to cushion your knees, such as an exercise mat. 2. Line up your arms and legs. Your hands should be below your shoulders, and your knees should be below your hips. 3. Lift your left leg behind you. At the same time, raise your right arm and straighten it in front of you. ? Do not lift your leg higher than your hip. ? Do not lift your arm higher than your shoulder. ? Keep your abdominal and back muscles tight. ? Keep your hips facing the  ground. ? Do not arch your back. ? Keep your balance carefully, and do not hold your breath. 4. Hold for __________ seconds. 5. Slowly return to the starting position and repeat with your right leg and your left arm. Repeat __________ times. Complete this exercise __________ times a day. Exercise C: Straight arm rows ( shoulder extension) 1. Stand with your feet shoulder width apart. 2. Secure an exercise band to a stable object in front of you so the band is at or above shoulder height. 3. Hold one end of the exercise band in each hand. 4. Straighten your elbows and lift your hands up to shoulder height. 5. Step back, away from the secured end of the exercise band, until the band stretches. 6. Squeeze your shoulder blades together and pull your hands down to the sides of your thighs. Stop when your hands are straight down by your sides. Do not let your hands go behind your body. 7. Hold for __________ seconds. 8. Slowly return to the starting position. Repeat __________ times. Complete this exercise __________ times a day. Exercise D: Shoulder external rotation, prone 1. Lie on your abdomen on a firm bed so your left / right forearm hangs over the edge of the bed and your upper arm is on the bed, straight out from your body. ? Your elbow should be bent. ? Your palm should be facing your feet. 2. If instructed, hold a __________ weight   in your hand. 3. Squeeze your shoulder blade toward the middle of your back. Do not let your shoulder lift toward your ear. 4. Keep your elbow bent in an "L" shape (90 degrees) while you slowly move your forearm up toward the ceiling. Move your forearm up to the height of the bed, toward your head. ? Your upper arm should not move. ? At the top of the movement, your palm should face the floor. 5. Hold for __________ seconds. 6. Slowly return to the starting position and relax your muscles. Repeat __________ times. Complete this exercise __________ times a  day. Exercise E: Scapular retraction and external rotation, rowing  1. Sit in a stable chair without armrests, or stand. 2. Secure an exercise band to a stable object in front of you so it is at shoulder height. 3. Hold one end of the exercise band in each hand. 4. Bring your arms out straight in front of you. 5. Step back, away from the secured end of the exercise band, until the band stretches. 6. Pull the band backward. As you do this, bend your elbows and squeeze your shoulder blades together, but avoid letting the rest of your body move. Do not let your shoulders lift up toward your ears. 7. Stop when your elbows are at your sides or slightly behind your body. 8. Hold for __________ seconds. 9. Slowly straighten your arms to return to the starting position. Repeat __________ times. Complete this exercise __________ times a day. Posture and body mechanics  Body mechanics refers to the movements and positions of your body while you do your daily activities. Posture is part of body mechanics. Good posture and healthy body mechanics can help to relieve stress in your body's tissues and joints. Good posture means that your spine is in its natural S-curve position (your spine is neutral), your shoulders are pulled back slightly, and your head is not tipped forward. The following are general guidelines for applying improved posture and body mechanics to your everyday activities. Standing   When standing, keep your spine neutral and your feet about hip-width apart. Keep a slight bend in your knees. Your ears, shoulders, and hips should line up.  When you do a task in which you lean forward while standing in one place for a long time, place one foot up on a stable object that is 2-4 inches (5-10 cm) high, such as a footstool. This helps keep your spine neutral. Sitting   When sitting, keep your spine neutral and keep your feet flat on the floor. Use a footrest, if necessary, and keep your thighs  parallel to the floor. Avoid rounding your shoulders, and avoid tilting your head forward.  When working at a desk or a computer, keep your desk at a height where your hands are slightly lower than your elbows. Slide your chair under your desk so you are close enough to maintain good posture.  When working at a computer, place your monitor at a height where you are looking straight ahead and you do not have to tilt your head forward or downward to look at the screen. Resting  When lying down and resting, avoid positions that are most painful for you.  If you have pain with activities such as sitting, bending, stooping, or squatting (flexion-based activities), lie in a position in which your body does not bend very much. For example, avoid curling up on your side with your arms and knees near your chest (fetal position).  If   you have pain with activities such as standing for a long time or reaching with your arms (extension-based activities), lie with your spine in a neutral position and bend your knees slightly. Try the following positions:  Lying on your side with a pillow between your knees.  Lying on your back with a pillow under your knees.  Lifting   When lifting objects, keep your feet at least shoulder-width apart and tighten your abdominal muscles.  Bend your knees and hips and keep your spine neutral. It is important to lift using the strength of your legs, not your back. Do not lock your knees straight out.  Always ask for help to lift heavy or awkward objects. This information is not intended to replace advice given to you by your health care provider. Make sure you discuss any questions you have with your health care provider. Document Released: 10/19/2005 Document Revised: 06/25/2016 Document Reviewed: 07/31/2015 Elsevier Interactive Patient Education  2018 [employer]vier Inc.  

## 2017-05-26 ENCOUNTER — Encounter (INDEPENDENT_AMBULATORY_CARE_PROVIDER_SITE_OTHER): Payer: Self-pay

## 2017-05-26 ENCOUNTER — Encounter: Payer: Self-pay | Admitting: Pediatrics

## 2017-05-26 ENCOUNTER — Ambulatory Visit (INDEPENDENT_AMBULATORY_CARE_PROVIDER_SITE_OTHER): Payer: BLUE CROSS/BLUE SHIELD | Admitting: Pediatrics

## 2017-05-26 VITALS — BP 129/81 | HR 81 | Temp 98.2°F | Ht 64.5 in | Wt 131.0 lb

## 2017-05-26 DIAGNOSIS — Z0000 Encounter for general adult medical examination without abnormal findings: Secondary | ICD-10-CM

## 2017-05-26 NOTE — Progress Notes (Signed)
  Subjective:   Patient ID: Amanda Legatoamela Sue Barker Hardy, female    DOB: Apr 19, 1961, 56 y.o.   MRN: [id number] CC: annual exam  HPI: Amanda Hardy is a 56 y.o. female presenting for Abnormal Pap Smear and Hot Flashes  Having worsening hot flash symptoms for past three months Comes on all of a sudden, especially at night Doesn't want medication  No fam h/o colon cancer or breast cancer Gets mammograms q2 yrs Husband has ulcerative colitis  Pt declines colonoscopy, open to FIT testing No vaginal discharge  Relevant past medical, surgical, family and social history reviewed. Allergies and medications reviewed and updated. History  Smoking Status  . Former Smoker  Smokeless Tobacco  . Never Used   ROS:All systems negative other than what is in HPI  Objective:    BP 129/81   Pulse 81   Temp 98.2 F (36.8 C) (Oral)   Ht 5' 4.5" (1.638 m)   Wt 131 lb (59.4 kg)   BMI 22.14 kg/m   Wt Readings from Last 3 Encounters:  05/26/17 131 lb (59.4 kg)  04/30/17 131 lb (59.4 kg)  04/07/17 131 lb 12.8 oz (59.8 kg)    Gen: NAD, alert, cooperative with exam, NCAT EYES: EOMI, no conjunctival injection, or no icterus ENT:  TMs pearly gray b/l, OP without erythema LYMPH: no cervical LAD CV: NRRR, normal S1/S2, no murmur, distal pulses 2+ b/l Resp: CTABL, no wheezes, normal WOB Abd: +BS, soft, NTND. no guarding or organomegaly Ext: No edema, warm Neuro: Alert and oriented MSK: normal muscle bulk Breast: nl b/l  GU: normal external female genitalia, normal appearing cervix, not friable  Chaperone present throughout exam  Assessment & Plan:  Amanda Hardy was seen today for annual exam  Diagnoses and all orders for this visit:  Encounter for preventive health examination -     Fecal occult blood, imunochemical; Future -     Pap IG and HPV (high risk) DNA detection   Follow up plan: Return in about 1 year (around 05/26/2018) for yearly physical. Rex Krasarol Mertie Haslem, MD [facility]

## 2017-05-28 LAB — PAP IG AND HPV HIGH-RISK
HPV, high-risk: NEGATIVE
PAP Smear Comment: 0

## 2017-06-03 ENCOUNTER — Other Ambulatory Visit: Payer: BLUE CROSS/BLUE SHIELD

## 2017-06-03 DIAGNOSIS — Z0000 Encounter for general adult medical examination without abnormal findings: Secondary | ICD-10-CM

## 2017-06-07 LAB — FECAL OCCULT BLOOD, IMMUNOCHEMICAL: FECAL OCCULT BLD: NEGATIVE

## 2017-07-02 ENCOUNTER — Ambulatory Visit (INDEPENDENT_AMBULATORY_CARE_PROVIDER_SITE_OTHER): Payer: BLUE CROSS/BLUE SHIELD | Admitting: Family

## 2017-07-02 ENCOUNTER — Encounter: Payer: Self-pay | Admitting: Family

## 2017-07-02 VITALS — BP 120/79 | HR 82 | Temp 98.3°F | Ht 64.5 in | Wt 131.4 lb

## 2017-07-02 DIAGNOSIS — J301 Allergic rhinitis due to pollen: Secondary | ICD-10-CM | POA: Diagnosis not present

## 2017-07-02 DIAGNOSIS — H9313 Tinnitus, bilateral: Secondary | ICD-10-CM | POA: Diagnosis not present

## 2017-07-02 DIAGNOSIS — R42 Dizziness and giddiness: Secondary | ICD-10-CM | POA: Diagnosis not present

## 2017-07-02 MED ORDER — MECLIZINE HCL 25 MG PO TABS: 25.0000 mg | ORAL_TABLET | Freq: Three times a day (TID) | ORAL | 0 refills | Status: DC | PRN

## 2017-07-02 MED ORDER — FLUTICASONE PROPIONATE 50 MCG/ACT NA SUSP: 2.0000 | Freq: Every day | NASAL | 6 refills | Status: DC

## 2017-07-02 NOTE — Patient Instructions (Signed)
Tinnitus Tinnitus refers to hearing a sound when there is no actual source for that sound. This is often described as ringing in the ears. However, people with this condition may hear a variety of noises. A person may hear the sound in one ear or in both ears. The sounds of tinnitus can be soft, loud, or somewhere in between. Tinnitus can last for a few seconds or can be constant for days. It may go away without treatment and come back at various times. When tinnitus is constant or happens often, it can lead to other problems, such as trouble sleeping and trouble concentrating. Almost everyone experiences tinnitus at some point. Tinnitus that is long-lasting (chronic) or comes back often is a problem that may require medical attention. What are the causes? The cause of tinnitus is often not known. In some cases, it can result from other problems or conditions, including:  Exposure to loud noises from machinery, music, or other sources.  Hearing loss.  Ear or sinus infections.  Earwax buildup.  A foreign object in the ear.  Use of certain medicines.  Use of alcohol and caffeine.  High blood pressure.  Heart diseases.  Anemia.  Allergies.  Meniere disease.  Thyroid problems.  Tumors.  An enlarged part of a weakened blood vessel (aneurysm).  What are the signs or symptoms? The main symptom of tinnitus is hearing a sound when there is no source for that sound. It may sound like:  Buzzing.  Roaring.  Ringing.  Blowing air, similar to the sound heard when you listen to a seashell.  Hissing.  Whistling.  Sizzling.  Humming.  Running water.  A sustained musical note.  How is this diagnosed? Tinnitus is diagnosed based on your symptoms. Your health care provider will do a physical exam. A comprehensive hearing exam (audiologic exam) will be done if your tinnitus:  Affects only one ear (unilateral).  Causes hearing difficulties.  Lasts 6 months or  longer.  You may also need to see a health care provider who specializes in hearing disorders (audiologist). You may be asked to complete a questionnaire to determine the severity of your tinnitus. Tests may be done to help determine the cause and to rule out other conditions. These can include:  Imaging studies of your head and brain, such as: ? A CT scan. ? An MRI.  An imaging study of your blood vessels (angiogram).  How is this treated? Treating an underlying medical condition can sometimes make tinnitus go away. If your tinnitus continues, other treatments may include:  Medicines, such as certain antidepressants or sleeping aids.  Sound generators to mask the tinnitus. These include: ? Tabletop sound machines that play relaxing sounds to help you fall asleep. ? Wearable devices that fit in your ear and play sounds or music. ? A small device that uses headphones to deliver a signal embedded in music (acoustic neural stimulation). In time, this may change the pathways of your brain and make you less sensitive to tinnitus. This device is used for very severe cases when no other treatment is working.  Therapy and counseling to help you manage the stress of living with tinnitus.  Using hearing aids or cochlear implants, if your tinnitus is related to hearing loss.  Follow these instructions at home:  When possible, avoid being in loud places and being exposed to loud sounds.  Wear hearing protection, such as earplugs, when you are exposed to loud noises.  Do not take stimulants, such as nicotine,   alcohol, or caffeine.  Practice techniques for reducing stress, such as meditation, yoga, or deep breathing.  Use a white noise machine, a humidifier, or other devices to mask the sound of tinnitus.  Sleep with your head slightly raised. This may reduce the impact of tinnitus.  Try to get plenty of rest each night. Contact a health care provider if:  You have tinnitus in just one  ear.  Your tinnitus continues for 3 weeks or longer without stopping.  Home care measures are not helping.  You have tinnitus after a head injury.  You have tinnitus along with any of the following: ? Dizziness. ? Loss of balance. ? Nausea and vomiting. This information is not intended to replace advice given to you by your health care provider. Make sure you discuss any questions you have with your health care provider. Document Released: 10/19/2005 Document Revised: 06/21/2016 Document Reviewed: 03/21/2014 Elsevier Interactive Patient Education  2018 Elsevier Inc.  

## 2017-07-02 NOTE — Progress Notes (Signed)
   Subjective:    Patient ID: Amanda Hardy, female    DOB: Jun 01, 1961, 56 y.o.   MRN: [id number]  Dizziness  This is a recurrent problem. The current episode started yesterday. The problem occurs intermittently. The problem has been waxing and waning. Associated symptoms include congestion, coughing, nausea and vertigo. Pertinent negatives include no chills, fever, headaches, myalgias or swollen glands. Associated symptoms comments: Sinus pressure. Nothing aggravates the symptoms. She has tried acetaminophen for the symptoms. The treatment provided mild relief.      Review of Systems  Constitutional: Negative for chills and fever.  HENT: Positive for congestion.   Respiratory: Positive for cough.   Gastrointestinal: Positive for nausea.  Musculoskeletal: Negative for myalgias.  Neurological: Positive for dizziness and vertigo. Negative for headaches.  All other systems reviewed and are negative.      Objective:   Physical Exam  Constitutional: She is oriented to person, place, and time. She appears well-developed and well-nourished. No distress.  HENT:  Head: Normocephalic and atraumatic.  Right Ear: External ear normal.  Left Ear: External ear normal.  Mouth/Throat: Posterior oropharyngeal edema present.  Eyes: Pupils are equal, round, and reactive to light.  Neck: Normal range of motion. Neck supple. No thyromegaly present.  Cardiovascular: Normal rate, regular rhythm, normal heart sounds and intact distal pulses.   No murmur heard. Pulmonary/Chest: Effort normal and breath sounds normal. No respiratory distress. She has no wheezes.  Abdominal: Soft. Bowel sounds are normal. She exhibits no distension. There is no tenderness.  Musculoskeletal: Normal range of motion. She exhibits no edema or tenderness.  Neurological: She is alert and oriented to person, place, and time. She has normal reflexes. No cranial nerve deficit.  Skin: Skin is warm and dry.  Psychiatric:  She has a normal mood and affect. Her behavior is normal. Judgment and thought content normal.  Vitals reviewed.     BP 120/79   Pulse 82   Temp 98.3 F (36.8 C) (Oral)   Ht 5' 4.5" (1.638 m)   Wt 131 lb 6.4 oz (59.6 kg)   BMI 22.21 kg/m      Assessment & Plan:  1. Tinnitus of both ears Avoid loud noises  2. Allergic rhinitis due to pollen, unspecified seasonality Start flonase - fluticasone (FLONASE) 50 MCG/ACT nasal spray; Place 2 sprays into both nostrils daily.  Dispense: 16 g; Refill: 6  3. Dizziness [place] precautions discussed - meclizine (ANTIVERT) 25 MG tablet; Take 1 tablet (25 mg total) by mouth 3 (three) times daily as needed for dizziness.  Dispense: 30 tablet; Refill: 0   Jannifer Rodneyhristy Addeline Calarco, FNP

## 2017-09-21 ENCOUNTER — Ambulatory Visit (INDEPENDENT_AMBULATORY_CARE_PROVIDER_SITE_OTHER): Payer: BLUE CROSS/BLUE SHIELD

## 2017-09-21 DIAGNOSIS — Z23 Encounter for immunization: Secondary | ICD-10-CM | POA: Diagnosis not present

## 2017-12-28 DIAGNOSIS — Z1231 Encounter for screening mammogram for malignant neoplasm of breast: Secondary | ICD-10-CM | POA: Diagnosis not present

## 2017-12-28 LAB — HM MAMMOGRAPHY

## 2017-12-30 ENCOUNTER — Ambulatory Visit: Payer: BLUE CROSS/BLUE SHIELD | Admitting: Pediatrics

## 2017-12-30 ENCOUNTER — Encounter: Payer: Self-pay | Admitting: Pediatrics

## 2017-12-30 VITALS — BP 116/77 | HR 84 | Temp 98.0°F | Ht 64.5 in | Wt 138.0 lb

## 2017-12-30 DIAGNOSIS — E039 Hypothyroidism, unspecified: Secondary | ICD-10-CM | POA: Diagnosis not present

## 2017-12-30 DIAGNOSIS — Z1159 Encounter for screening for other viral diseases: Secondary | ICD-10-CM

## 2017-12-30 MED ORDER — LEVOTHYROXINE SODIUM 75 MCG PO TABS: 75.0000 ug | ORAL_TABLET | Freq: Every day | ORAL | 3 refills | Status: DC

## 2017-12-30 NOTE — Progress Notes (Signed)
  Subjective:   Patient ID: Anthonette Legatoamela Sue Barker Tabb, female    DOB: 10-08-1961, 57 y.o.   MRN: [id number] CC: Medication Refill (synthroid)  HPI: Anthonette Legatoamela Sue Barker Chieffo is a 57 y.o. female presenting for Medication Refill (synthroid)  No palpitations, good exercise tolerance, no SOB or CP with exercise. Taking thyroid medicine regularly. Staying active.   Relevant past medical, surgical, family and social history reviewed. Allergies and medications reviewed and updated. Social History   Tobacco Use  Smoking Status Former Smoker  Smokeless Tobacco Never Used   ROS: Per HPI   Objective:    BP 116/77   Pulse 84   Temp 98 F (36.7 C) (Oral)   Ht 5' 4.5" (1.638 m)   Wt 138 lb (62.6 kg)   BMI 23.32 kg/m   Wt Readings from Last 3 Encounters:  12/30/17 138 lb (62.6 kg)  07/02/17 131 lb 6.4 oz (59.6 kg)  05/26/17 131 lb (59.4 kg)    Gen: NAD, alert, cooperative with exam, NCAT EYES: EOMI, no conjunctival injection, or no icterus ENT:  TMs pearly gray b/l, OP without erythema LYMPH: no cervical LAD NECK: nl thyroid CV: NRRR, normal S1/S2, no murmur, distal pulses 2+ b/l Resp: CTABL, no wheezes, normal WOB Abd: +BS, soft, NTND. no guarding or organomegaly Ext: No edema, warm Neuro: Alert and oriented, strength equal b/l UE and LE, coordination grossly normal MSK: normal muscle bulk  Assessment & Plan:  [name] was seen today for medication refill.  Diagnoses and all orders for this visit:  Hypothyroidism, unspecified type -     TSH -     levothyroxine (SYNTHROID, LEVOTHROID) 75 MCG tablet; Take 1 tablet (75 mcg total) by mouth daily.  Need for hepatitis C screening test -     Hepatitis C antibody   Follow up plan: 1 yr Rex Krasarol Jamal Pavon, MD [facility]

## 2017-12-31 DIAGNOSIS — H40033 Anatomical narrow angle, bilateral: Secondary | ICD-10-CM | POA: Diagnosis not present

## 2017-12-31 DIAGNOSIS — G44219 Episodic tension-type headache, not intractable: Secondary | ICD-10-CM | POA: Diagnosis not present

## 2017-12-31 LAB — HEPATITIS C ANTIBODY

## 2017-12-31 LAB — TSH: TSH: 1.18 u[IU]/mL (ref 0.450–4.500)

## 2018-01-17 ENCOUNTER — Other Ambulatory Visit: Payer: Self-pay | Admitting: Pediatrics

## 2018-01-17 DIAGNOSIS — N6320 Unspecified lump in the left breast, unspecified quadrant: Secondary | ICD-10-CM

## 2018-01-25 ENCOUNTER — Encounter (HOSPITAL_COMMUNITY): Payer: Self-pay

## 2018-01-25 ENCOUNTER — Ambulatory Visit (HOSPITAL_COMMUNITY)
Admission: RE | Admit: 2018-01-25 | Discharge: 2018-01-25 | Disposition: A | Discharge status: Home / Self Care | Payer: BLUE CROSS/BLUE SHIELD | Source: Ambulatory Visit | Attending: Pediatrics | Admitting: Pediatrics

## 2018-01-25 DIAGNOSIS — N6320 Unspecified lump in the left breast, unspecified quadrant: Secondary | ICD-10-CM | POA: Diagnosis not present

## 2018-01-25 DIAGNOSIS — N6489 Other specified disorders of breast: Secondary | ICD-10-CM | POA: Diagnosis not present

## 2018-01-25 DIAGNOSIS — N6321 Unspecified lump in the left breast, upper outer quadrant: Secondary | ICD-10-CM | POA: Diagnosis not present

## 2018-01-25 DIAGNOSIS — R922 Inconclusive mammogram: Secondary | ICD-10-CM | POA: Diagnosis not present

## 2018-07-05 NOTE — Progress Notes (Unsigned)
[facility]  

## 2018-10-11 ENCOUNTER — Ambulatory Visit: Payer: BLUE CROSS/BLUE SHIELD | Admitting: Family Medicine

## 2018-10-11 ENCOUNTER — Encounter: Payer: Self-pay | Admitting: Family Medicine

## 2018-10-11 VITALS — BP 126/86 | HR 90 | Temp 99.1°F | Ht 64.5 in | Wt 132.0 lb

## 2018-10-11 DIAGNOSIS — J0100 Acute maxillary sinusitis, unspecified: Secondary | ICD-10-CM

## 2018-10-11 DIAGNOSIS — J069 Acute upper respiratory infection, unspecified: Secondary | ICD-10-CM

## 2018-10-11 MED ORDER — FLUTICASONE PROPIONATE 50 MCG/ACT NA SUSP: 2.0000 | Freq: Every day | NASAL | 6 refills | Status: DC

## 2018-10-11 MED ORDER — AMOXICILLIN-POT CLAVULANATE 875-125 MG PO TABS: 1.0000 | ORAL_TABLET | Freq: Two times a day (BID) | ORAL | 0 refills | Status: AC

## 2018-10-11 MED ORDER — CETIRIZINE HCL 10 MG PO TABS: 10.0000 mg | ORAL_TABLET | Freq: Every day | ORAL | 11 refills | Status: DC

## 2018-10-11 NOTE — Patient Instructions (Signed)
- Get plenty of rest and drink plenty of fluids. - Try to breathe moist air. Use a cold mist humidifier. - Consume warm fluids (soup or tea) to provide relief for a stuffy nose and to loosen phlegm. - For cough and congestion you can use plain Mucinex, regular or max strength, follow box directions.  - For nasal stuffiness, try saline nasal spray or a Neti Pot. You can use saline nasal spray 4 times daily. Do not use tap water in the [facility], follow instructions on box for proper use. Afrin nasal spray can also be used but this product should not be used longer than 3 days or it will cause rebound nasal stuffiness (worsening nasal congestion). - For sore throat pain relief: use chloraseptic spray, suck on throat lozenges, hard candy or popsicles; gargle with warm salt water (1/4 tsp. salt per 8 oz. of water); and eat soft, bland foods. - For fever or aches and pains take tylenol or motrin as appropriate for age and weight.  - Eat a well-balanced diet. If you cannot, ensure you are getting enough nutrients by taking a daily multivitamin. - Avoid dairy products, as they can thicken phlegm. - Avoid alcohol, as it impairs your body's immune system. - Change your toothbrush in 3 days.   CONTACT YOUR DOCTOR IF YOU EXPERIENCE ANY OF THE FOLLOWING: - High fever - Ear pain - Sinus-type headache - Unusually severe cold symptoms - Cough that gets worse while other cold symptoms improve - Flare up of any chronic lung problem, such as asthma - Your symptoms persist longer than 2 weeks  Upper Respiratory Infection, Adult Most upper respiratory infections (URIs) are caused by a virus. A URI affects the nose, throat, and upper air passages. The most common type of URI is often called "the common cold." Follow these instructions at home:  Take medicines only as told by your doctor.  Gargle warm saltwater or take cough drops to comfort your throat as told by your doctor.  Use a warm mist humidifier or  inhale steam from a shower to increase air moisture. This may make it easier to breathe.  Drink enough fluid to keep your pee (urine) clear or pale yellow.  Eat soups and other clear broths.  Have a healthy diet.  Rest as needed.  Go back to work when your fever is gone or your doctor says it is okay. ? You may need to stay home longer to avoid giving your URI to others. ? You can also wear a face mask and wash your hands often to prevent spread of the virus.  Use your inhaler more if you have asthma.  Do not use any tobacco products, including cigarettes, chewing tobacco, or electronic cigarettes. If you need help quitting, ask your doctor. Contact a doctor if:  You are getting worse, not better.  Your symptoms are not helped by medicine.  You have chills.  You are getting more short of breath.  You have brown or red mucus.  You have yellow or brown discharge from your nose.  You have pain in your face, especially when you bend forward.  You have a fever.  You have puffy (swollen) neck glands.  You have pain while swallowing.  You have white areas in the back of your throat. Get help right away if:  You have very bad or constant: ? Headache. ? Ear pain. ? Pain in your forehead, behind your eyes, and over your cheekbones (sinus pain). ? Chest  pain.  You have long-lasting (chronic) lung disease and any of the following: ? Wheezing. ? Long-lasting cough. ? Coughing up blood. ? A change in your usual mucus.  You have a stiff neck.  You have changes in your: ? Vision. ? Hearing. ? Thinking. ? Mood. This information is not intended to replace advice given to you by your health care provider. Make sure you discuss any questions you have with your health care provider. Document Released: 04/06/2008 Document Revised: 06/21/2016 Document Reviewed: 01/24/2014 Elsevier Interactive Patient Education  2018 [employer]

## 2018-10-11 NOTE — Progress Notes (Signed)
Subjective:    Patient ID: Amanda Hardy, female    DOB: [birth year], 57 y.o.   MRN: [id number]  Chief Complaint:  Sinusitis (sinus drainage, congestion, hard to take a deep breath, sore throat (about 2 weeks ago))   HPI: Amanda Hardy is a 57 y.o. female presenting on 10/11/2018 for Sinusitis (sinus drainage, congestion, hard to take a deep breath, sore throat (about 2 weeks ago))  Pt presents today with 2-3 weeks of cough, congestion, sore throat, postnasal drainage, sinus pressure, rhinorrhea, fever, chills, and fatigue. Pt denies chest pain or shortness of breath, states it feels as if it is hard to take a deep breath. Denies weakness, focal neuro deficits, syncope, or abdominal pain.    Relevant past medical, surgical, family, and social history reviewed and updated as indicated.  Allergies and medications reviewed and updated.   Past Medical History:  Diagnosis Date  . Cervical disc disease   . History of multiple concussions    57 yo first time  . Ruptured lumbar disc   . Thyroid disease     Past Surgical History:  Procedure Laterality Date  . DILATION AND CURETTAGE OF UTERUS      Social History   Socioeconomic History  . Marital status: Married    Spouse name: Not on file  . Number of children: Not on file  . Years of education: Not on file  . Highest education level: Not on file  Occupational History  . Not on file  Social Needs  . Financial resource strain: Not on file  . Food insecurity:    Worry: Not on file    Inability: Not on file  . Transportation needs:    Medical: Not on file    Non-medical: Not on file  Tobacco Use  . Smoking status: Former Games developer  . Smokeless tobacco: Never Used  Substance and Sexual Activity  . Alcohol use: No    Alcohol/week: 0.0 standard drinks  . Drug use: No  . Sexual activity: Not on file  Lifestyle  . Physical activity:    Days per week: Not on file    Minutes per session: Not on file  .  Stress: Not on file  Relationships  . Social connections:    Talks on phone: Not on file    Gets together: Not on file    Attends religious service: Not on file    Active member of club or organization: Not on file    Attends meetings of clubs or organizations: Not on file    Relationship status: Not on file  . Intimate partner violence:    Fear of current or ex partner: Not on file    Emotionally abused: Not on file    Physically abused: Not on file    Forced sexual activity: Not on file  Other Topics Concern  . Not on file  Social History Narrative   Lives with husband in a 2 story home.  Has 1 child.     Education: high school.  Housewife.      Outpatient Encounter Medications as of 10/11/2018  Medication Sig  . BIOTIN PO Take by mouth.  . cyclobenzaprine (FLEXERIL) 5 MG tablet Take 1 tablet (5 mg total) by mouth 3 (three) times daily as needed for muscle spasms.  . Docosahexaenoic Acid (DHA PO) Take by mouth.  . Garlic 1000 MG CAPS Take by mouth.  . levothyroxine (SYNTHROID, LEVOTHROID) 75 MCG tablet Take 1 tablet (  75 mcg total) by mouth daily.  Marland Kitchen amoxicillin-clavulanate (AUGMENTIN) 875-125 MG tablet Take 1 tablet by mouth 2 (two) times daily for 7 days.  . cetirizine (ZYRTEC) 10 MG tablet Take 1 tablet (10 mg total) by mouth daily.  . fluticasone (FLONASE) 50 MCG/ACT nasal spray Place 2 sprays into both nostrils daily.  . [DISCONTINUED] fluticasone (FLONASE) 50 MCG/ACT nasal spray Place 2 sprays into both nostrils daily. (Patient not taking: Reported on 12/30/2017)  . [DISCONTINUED] meclizine (ANTIVERT) 25 MG tablet Take 1 tablet (25 mg total) by mouth 3 (three) times daily as needed for dizziness. (Patient not taking: Reported on 12/30/2017)   No facility-administered encounter medications on file as of 10/11/2018.     Allergies  Allergen Reactions  . Sulfur Rash    Family History, Prefers not to take    Review of Systems  Constitutional: Positive for chills, fatigue  and fever.  HENT: Positive for congestion, postnasal drip, rhinorrhea, sinus pressure, sinus pain, sore throat and voice change.   Respiratory: Positive for cough. Negative for shortness of breath.   Cardiovascular: Negative for chest pain and palpitations.  Gastrointestinal: Negative for abdominal pain, constipation, diarrhea, nausea and vomiting.  Musculoskeletal: Negative for myalgias.  Neurological: Positive for headaches. Negative for dizziness, syncope, weakness and light-headedness.  Psychiatric/Behavioral: Negative for confusion.  All other systems reviewed and are negative.       Objective:    BP 126/86   Pulse 90   Temp 99.1 F (37.3 C) (Oral)   Ht 5' 4.5" (1.638 m)   Wt 132 lb (59.9 kg)   BMI 22.31 kg/m    Wt Readings from Last 3 Encounters:  10/11/18 132 lb (59.9 kg)  12/30/17 138 lb (62.6 kg)  07/02/17 131 lb 6.4 oz (59.6 kg)    Physical Exam  Constitutional: She is oriented to person, place, and time. She appears well-developed and well-nourished. She is cooperative. No distress.  HENT:  Head: Normocephalic and atraumatic.  Right Ear: Hearing, external ear and ear canal normal. Tympanic membrane is not perforated and not erythematous. A middle ear effusion is present.  Left Ear: Hearing, external ear and ear canal normal. Tympanic membrane is injected. Tympanic membrane is not perforated and not erythematous. A middle ear effusion is present.  Nose: Mucosal edema and rhinorrhea present. Right sinus exhibits maxillary sinus tenderness. Right sinus exhibits no frontal sinus tenderness. Left sinus exhibits maxillary sinus tenderness. Left sinus exhibits no frontal sinus tenderness.  Mouth/Throat: Uvula is midline and mucous membranes are normal. Posterior oropharyngeal edema and posterior oropharyngeal erythema present. No oropharyngeal exudate or tonsillar abscesses. No tonsillar exudate.  Postnasal drainage   Eyes: Pupils are equal, round, and reactive to light.  Conjunctivae and lids are normal.  Neck: Trachea normal and phonation normal. Neck supple. No JVD present. No thyroid mass and no thyromegaly present.  Cardiovascular: Normal rate, regular rhythm and normal heart sounds. Exam reveals no gallop and no friction rub.  No murmur heard. Pulmonary/Chest: Effort normal and breath sounds normal. No respiratory distress.  Lymphadenopathy:       Head (right side): Tonsillar adenopathy present.       Head (left side): Tonsillar adenopathy present.  Neurological: She is alert and oriented to person, place, and time.  Skin: Skin is warm and dry. Capillary refill takes less than 2 seconds.  Psychiatric: She has a normal mood and affect. Her speech is normal and behavior is normal. Judgment and thought content normal. Cognition and memory are normal.  Nursing note and vitals reviewed.   Results for orders placed or performed in visit on 07/05/18  [facility] MAMMOGRAPHY  Result Value Ref Range   HM Mammogram 0-4 Bi-Rad 0-4 Bi-Rad, Self Reported Normal       Pertinent labs & imaging results that were available during my care of the patient were reviewed by me and considered in my medical decision making.  Assessment & Plan:  [name] was seen today for sinusitis.  Diagnoses and all orders for this visit:  URI with cough and congestion -     fluticasone (FLONASE) 50 MCG/ACT nasal spray; Place 2 sprays into both nostrils daily. -     cetirizine (ZYRTEC) 10 MG tablet; Take 1 tablet (10 mg total) by mouth daily. -     amoxicillin-clavulanate (AUGMENTIN) 875-125 MG tablet; Take 1 tablet by mouth 2 (two) times daily for 7 days.  Acute non-recurrent maxillary sinusitis -     fluticasone (FLONASE) 50 MCG/ACT nasal spray; Place 2 sprays into both nostrils daily. -     cetirizine (ZYRTEC) 10 MG tablet; Take 1 tablet (10 mg total) by mouth daily. -     amoxicillin-clavulanate (AUGMENTIN) 875-125 MG tablet; Take 1 tablet by mouth 2 (two) times daily for 7 days.    Increase fluid intake. Can use saline nasal spray. Add humidity to the air. Cough drops or throat lozenges if beneficial. Medications as prescribed. Report any new or worsening symptoms.    Continue all other maintenance medications.  Follow up plan: No follow-ups on file.  Educational handout given for URI  The above assessment and management plan was discussed with the patient. The patient verbalized understanding of and has agreed to the management plan. Patient is aware to call the clinic if symptoms persist or worsen. Patient is aware when to return to the clinic for a follow-up visit. Patient educated on when it is appropriate to go to the emergency department.   Kari BaarsMichelle Millisa Giarrusso, FNP-C [facility] [phone]

## 2018-12-29 ENCOUNTER — Encounter: Payer: Self-pay | Admitting: Family Medicine

## 2018-12-29 ENCOUNTER — Ambulatory Visit: Payer: BLUE CROSS/BLUE SHIELD | Admitting: Family Medicine

## 2018-12-29 DIAGNOSIS — E039 Hypothyroidism, unspecified: Secondary | ICD-10-CM | POA: Diagnosis not present

## 2018-12-29 MED ORDER — LEVOTHYROXINE SODIUM 75 MCG PO TABS: 75.0000 ug | ORAL_TABLET | Freq: Every day | ORAL | 1 refills | Status: DC

## 2018-12-29 NOTE — Progress Notes (Signed)
Subjective:     Amanda Hardy is a 58 y.o. female who presents for follow up of hypothyroidism. Current symptoms: none. Patient denies change in energy level, diarrhea, heat / cold intolerance, nervousness, palpitations and weight changes. Symptoms have been well-controlled.  The following portions of the patient's history were reviewed and updated as appropriate: allergies, current medications, past family history, past medical history, past social history, past surgical history and problem list.  Review of Systems Constitutional: negative Eyes: negative Ears, nose, mouth, throat, and face: negative Respiratory: negative Cardiovascular: negative Gastrointestinal: negative Integument/breast: negative Musculoskeletal:negative Neurological: negative Endocrine: negative    Objective:    BP 131/78   Pulse 91   Temp 98.9 F (37.2 C) (Oral)   Ht 5' 4.5" (1.638 m)   Wt 134 lb (60.8 kg)   BMI 22.65 kg/m   General Appearance:    Alert, cooperative, no distress, appears stated age  Head:    Normocephalic, without obvious abnormality, atraumatic  Eyes:    PERRL, conjunctiva/corneas clear, EOM's intact, fundi    benign, both eyes  Ears:    Normal TM's and external ear canals, both ears  Nose:   Nares normal, septum midline, mucosa normal, no drainage    or sinus tenderness  Throat:   Lips, mucosa, and tongue normal; teeth and gums normal  Neck:   Supple, symmetrical, trachea midline, no adenopathy;    thyroid:  no enlargement/tenderness/nodules; no carotid   bruit or JVD  Back:     Symmetric, no curvature, ROM normal, no CVA tenderness  Lungs:     Clear to auscultation bilaterally, respirations unlabored  Chest Wall:    No tenderness or deformity   Heart:    Regular rate and rhythm, S1 and S2 normal, no murmur, rub   or gallop  Breast Exam:    No tenderness, masses, or nipple abnormality  Abdomen:     Soft, non-tender, bowel sounds active all four quadrants,    no  masses, no organomegaly  Genitalia:    Normal female without lesion, discharge or tenderness  Rectal:    Normal tone, normal prostate, no masses or tenderness;   guaiac negative stool  Extremities:   Extremities normal, atraumatic, no cyanosis or edema  Pulses:   2+ and symmetric all extremities  Skin:   Skin color, texture, turgor normal, no rashes or lesions  Lymph nodes:   Cervical, supraclavicular, and axillary nodes normal  Neurologic:   CNII-XII intact, normal strength, sensation and reflexes    throughout    Laboratory: Lab Results  Component Value Date   TSH 1.180 12/30/2017      Assessment:     Amanda Hardy was seen today for hypothyroidism.  Diagnoses and all orders for this visit:  Acquired hypothyroidism Labs pending, will change therapy if warranted.  -     levothyroxine (SYNTHROID, LEVOTHROID) 75 MCG tablet; Take 1 tablet (75 mcg total) by mouth daily. -     TSH     Plan:    1. L-thyroxine per orders. 2. Recheck thyroid function tests in 12 months. 3. Instructed not to take multivitamins or iron within 4 hours of taking thyroid medications. 4. Follow up in 1 year.    The above assessment and management plan was discussed with the patient. The patient verbalized understanding of and has agreed to the management plan. Patient is aware to call the clinic if symptoms fail to improve or worsen. Patient is aware when to return to the  clinic for a follow-up visit. Patient educated on when it is appropriate to go to the emergency department.   Monia Pouch, FNP-C [facility] [address] [place], [place] [address] [phone]

## 2018-12-29 NOTE — Patient Instructions (Signed)
Hypothyroidism  Hypothyroidism is when the thyroid gland does not make enough of certain hormones (it is underactive). The thyroid gland is a small gland located in the lower front part of the neck, just in front of the windpipe (trachea). This gland makes hormones that help control how the body uses food for energy (metabolism) as well as how the heart and brain function. These hormones also play a role in keeping your bones strong. When the thyroid is underactive, it produces too little of the hormones thyroxine (T4) and triiodothyronine (T3). What are the causes? This condition may be caused by:  Hashimoto's disease. This is a disease in which the body's disease-fighting system (immune system) attacks the thyroid gland. This is the most common cause.  Viral infections.  Pregnancy.  Certain medicines.  Birth defects.  Past radiation treatments to the head or neck for cancer.  Past treatment with radioactive iodine.  Past exposure to radiation in the environment.  Past surgical removal of part or all of the thyroid.  Problems with a gland in the center of the brain (pituitary gland).  Lack of enough iodine in the diet. What increases the risk? You are more likely to develop this condition if:  You are female.  You have a family history of thyroid conditions.  You use a medicine called lithium.  You take medicines that affect the immune system (immunosuppressants). What are the signs or symptoms? Symptoms of this condition include:  Feeling as though you have no energy (lethargy).  Not being able to tolerate cold.  Weight gain that is not explained by a change in diet or exercise habits.  Lack of appetite.  Dry skin.  Coarse hair.  Menstrual irregularity.  Slowing of thought processes.  Constipation.  Sadness or depression. How is this diagnosed? This condition may be diagnosed based on:  Your symptoms, your medical history, and a physical exam.  Blood  tests. You may also have imaging tests, such as an ultrasound or MRI. How is this treated? This condition is treated with medicine that replaces the thyroid hormones that your body does not make. After you begin treatment, it may take several weeks for symptoms to go away. Follow these instructions at home:  Take over-the-counter and prescription medicines only as told by your health care provider.  If you start taking any new medicines, tell your health care provider.  Keep all follow-up visits as told by your health care provider. This is important. ? As your condition improves, your dosage of thyroid hormone medicine may change. ? You will need to have blood tests regularly so that your health care provider can monitor your condition. Contact a health care provider if:  Your symptoms do not get better with treatment.  You are taking thyroid replacement medicine and you: ? Sweat a lot. ? Have tremors. ? Feel anxious. ? Lose weight rapidly. ? Cannot tolerate heat. ? Have emotional swings. ? Have diarrhea. ? Feel weak. Get help right away if you have:  Chest pain.  An irregular heartbeat.  A rapid heartbeat.  Difficulty breathing. Summary  Hypothyroidism is when the thyroid gland does not make enough of certain hormones (it is underactive).  When the thyroid is underactive, it produces too little of the hormones thyroxine (T4) and triiodothyronine (T3).  The most common cause is Hashimoto's disease, a disease in which the body's disease-fighting system (immune system) attacks the thyroid gland. The condition can also be caused by viral infections, medicine, pregnancy, or past   radiation treatment to the head or neck.  Symptoms may include weight gain, dry skin, constipation, feeling as though you do not have energy, and not being able to tolerate cold.  This condition is treated with medicine to replace the thyroid hormones that your body does not make. This information  is not intended to replace advice given to you by your health care provider. Make sure you discuss any questions you have with your health care provider. Document Released: 10/19/2005 Document Revised: 09/29/2017 Document Reviewed: 09/29/2017 Elsevier Interactive Patient Education  2019 Elsevier Inc.  

## 2018-12-30 LAB — TSH: TSH: 1.25 u[IU]/mL (ref 0.450–4.500)

## 2019-01-21 ENCOUNTER — Ambulatory Visit: Payer: BLUE CROSS/BLUE SHIELD | Admitting: Nurse Practitioner

## 2019-01-21 ENCOUNTER — Other Ambulatory Visit: Payer: Self-pay

## 2019-01-21 ENCOUNTER — Encounter: Payer: Self-pay | Admitting: Nurse Practitioner

## 2019-01-21 VITALS — BP 132/72 | HR 90 | Temp 97.4°F | Ht 64.5 in | Wt 131.8 lb

## 2019-01-21 DIAGNOSIS — G8929 Other chronic pain: Secondary | ICD-10-CM | POA: Diagnosis not present

## 2019-01-21 DIAGNOSIS — M545 Low back pain, unspecified: Secondary | ICD-10-CM

## 2019-01-21 DIAGNOSIS — M546 Pain in thoracic spine: Secondary | ICD-10-CM | POA: Diagnosis not present

## 2019-01-21 MED ORDER — CYCLOBENZAPRINE HCL 5 MG PO TABS
5.0000 mg | ORAL_TABLET | Freq: Three times a day (TID) | ORAL | 2 refills | Status: DC | PRN
Start: 2019-01-21 — End: 2020-12-20

## 2019-01-21 NOTE — Progress Notes (Signed)
   Subjective:    Patient ID: Amanda Hardy, female    DOB: 22-Jun-1961, 58 y.o.   MRN: [id number]   Chief Complaint: Tingling (bilateral hands and feet- x 1 day) and Hip Pain (left)   HPI Patient comes in today c/o low back pain. She has ben pulling weeds and that is what flared it up. Rate [place] 7/10 currently. Sitting to long makes it worse. Heating pad helps. She has some tingling in bul feet. She has a long history of back problems.    Review of Systems  Constitutional: Negative.   Respiratory: Negative.   Cardiovascular: Negative.   Gastrointestinal: Negative.   Musculoskeletal: Positive for back pain.  Neurological: Negative.   Psychiatric/Behavioral: Negative.   All other systems reviewed and are negative.      Objective:   Physical Exam Vitals signs and nursing note reviewed.  Constitutional:      Appearance: Normal appearance. She is normal weight.  Cardiovascular:     Rate and Rhythm: Normal rate and regular rhythm.     Heart sounds: Normal heart sounds.  Pulmonary:     Breath sounds: Normal breath sounds.  Musculoskeletal:     Comments: fROM of lumbar spine with pain on flexion and extension (-) SLR Motor strength and sensation distally intact  Skin:    General: Skin is warm.  Neurological:     General: No focal deficit present.     Mental Status: She is alert and oriented to person, place, and time.     Cranial Nerves: No cranial nerve deficit.     Sensory: No sensory deficit.  Psychiatric:        Mood and Affect: Mood normal.        Behavior: Behavior normal.    BP 132/72   Pulse 90   Temp (!) 97.4 F (36.3 C) (Oral)   Ht 5' 4.5" (1.638 m)   Wt 131 lb 12.8 oz (59.8 kg)   BMI 22.27 kg/m       Assessment & Plan:  Amanda Hardy in today with chief complaint of Tingling (bilateral hands and feet- x 1 day) and Hip Pain (left)   1. Acute bilateral low back pain without sciatica Moist heat Rest No lifting RTOprn Motrin  or tylenol OTC - cyclobenzaprine (FLEXERIL) 5 MG tablet; Take 1 tablet (5 mg total) by mouth 3 (three) times daily as needed for muscle spasms.  Dispense: 60 tablet; Refill: 2  Mary-Margaret Daphine Deutscher, FNP

## 2019-01-21 NOTE — Patient Instructions (Signed)
Acute Back Pain, Adult  Acute back pain is sudden and usually short-lived. It is often caused by an injury to the muscles and tissues in the back. The injury may result from:   A muscle or ligament getting overstretched or torn (strained). Ligaments are tissues that connect bones to each other. Lifting something improperly can cause a back strain.   Wear and tear (degeneration) of the spinal disks. Spinal disks are circular tissue that provides cushioning between the bones of the spine (vertebrae).   Twisting motions, such as while playing sports or doing yard work.   A hit to the back.   Arthritis.  You may have a physical exam, lab tests, and imaging tests to find the cause of your pain. Acute back pain usually goes away with rest and home care.  Follow these instructions at home:  Managing pain, stiffness, and swelling   Take over-the-counter and prescription medicines only as told by your health care provider.   Your health care provider may recommend applying ice during the first 24-48 hours after your pain starts. To do this:  ? Put ice in a plastic bag.  ? Place a towel between your skin and the bag.  ? Leave the ice on for 20 minutes, 2-3 times a day.   If directed, apply heat to the affected area as often as told by your health care provider. Use the heat source that your health care provider recommends, such as a moist heat pack or a heating pad.  ? Place a towel between your skin and the heat source.  ? Leave the heat on for 20-30 minutes.  ? Remove the heat if your skin turns bright red. This is especially important if you are unable to feel pain, heat, or cold. You have a greater risk of getting burned.  Activity     Do not stay in bed. Staying in bed for more than 1-2 days can delay your recovery.   Sit up and stand up straight. Avoid leaning forward when you sit, or hunching over when you stand.  ? If you work at a desk, sit close to it so you do not need to lean over. Keep your chin tucked  in. Keep your neck drawn back, and keep your elbows bent at a right angle. Your arms should look like the letter "L."  ? Sit high and close to the steering wheel when you drive. Add lower back (lumbar) support to your car seat, if needed.   Take short walks on even surfaces as soon as you are able. Try to increase the length of time you walk each day.   Do not sit, drive, or stand in one place for more than 30 minutes at a time. Sitting or standing for long periods of time can put stress on your back.   Do not drive or use heavy machinery while taking prescription pain medicine.   Use proper lifting techniques. When you bend and lift, use positions that put less stress on your back:  ? Bend your knees.  ? Keep the load close to your body.  ? Avoid twisting.   Exercise regularly as told by your health care provider. Exercising helps your back heal faster and helps prevent back injuries by keeping muscles strong and flexible.   Work with a physical therapist to make a safe exercise program, as recommended by your health care provider. Do any exercises as told by your physical therapist.  Lifestyle   Maintain   a healthy weight. Extra weight puts stress on your back and makes it difficult to have good posture.   Avoid activities or situations that make you feel anxious or stressed. Stress and anxiety increase muscle tension and can make back pain worse. Learn ways to manage anxiety and stress, such as through exercise.  General instructions   Sleep on a firm mattress in a comfortable position. Try lying on your side with your knees slightly bent. If you lie on your back, put a pillow under your knees.   Follow your treatment plan as told by your health care provider. This may include:  ? Cognitive or behavioral therapy.  ? Acupuncture or massage therapy.  ? Meditation or yoga.  Contact a health care provider if:   You have pain that is not relieved with rest or medicine.   You have increasing pain going down  into your legs or buttocks.   Your pain does not improve after 2 weeks.   You have pain at night.   You lose weight without trying.   You have a fever or chills.  Get help right away if:   You develop new bowel or bladder control problems.   You have unusual weakness or numbness in your arms or legs.   You develop nausea or vomiting.   You develop abdominal pain.   You feel faint.  Summary   Acute back pain is sudden and usually short-lived.   Use proper lifting techniques. When you bend and lift, use positions that put less stress on your back.   Take over-the-counter and prescription medicines and apply heat or ice as directed by your health care provider.  This information is not intended to replace advice given to you by your health care provider. Make sure you discuss any questions you have with your health care provider.  Document Released: 10/19/2005 Document Revised: 05/26/2018 Document Reviewed: 06/02/2017  Elsevier Interactive Patient Education  2019 [employer] Inc[employer]

## 2019-06-22 ENCOUNTER — Other Ambulatory Visit: Payer: Self-pay

## 2019-06-23 ENCOUNTER — Ambulatory Visit: Payer: BLUE CROSS/BLUE SHIELD | Admitting: Family Medicine

## 2019-06-23 ENCOUNTER — Encounter: Payer: Self-pay | Admitting: Family Medicine

## 2019-06-23 VITALS — BP 120/77 | HR 81 | Temp 99.0°F | Ht 64.5 in | Wt 135.0 lb

## 2019-06-23 DIAGNOSIS — Z1212 Encounter for screening for malignant neoplasm of rectum: Secondary | ICD-10-CM

## 2019-06-23 DIAGNOSIS — E039 Hypothyroidism, unspecified: Secondary | ICD-10-CM | POA: Diagnosis not present

## 2019-06-23 DIAGNOSIS — Z1211 Encounter for screening for malignant neoplasm of colon: Secondary | ICD-10-CM

## 2019-06-23 MED ORDER — LEVOTHYROXINE SODIUM 75 MCG PO TABS: 75.0000 ug | ORAL_TABLET | Freq: Every day | ORAL | 2 refills | Status: DC

## 2019-06-23 NOTE — Progress Notes (Signed)
Subjective:  Patient ID: Amanda Hardy, female    DOB: 01-25-1961, 58 y.o.   MRN: [id number]  Patient Care Team: Baruch Gouty, FNP as PCP - General (Family Medicine)   Chief Complaint:  Medical Management of Chronic Issues (6 mo ) and Hypothyroidism   HPI: Amanda Hardy is a 58 y.o. female presenting on 06/23/2019 for Medical Management of Chronic Issues (6 mo ) and Hypothyroidism   Pt presents today for follow up of hypothyroidism. Pt is compliant with medications. No hypo or hyperthyroid symptoms.   Thyroid Problem Presents for follow-up visit. Patient reports no anxiety, cold intolerance, constipation, depressed mood, diaphoresis, diarrhea, dry skin, fatigue, hair loss, heat intolerance, hoarse voice, leg swelling, nail problem, palpitations, tremors, visual change, weight gain or weight loss.     Relevant past medical, surgical, family, and social history reviewed and updated as indicated.  Allergies and medications reviewed and updated. Date reviewed: Chart in Epic.   Past Medical History:  Diagnosis Date  . Cervical disc disease   . History of multiple concussions    58 yo first time  . Ruptured lumbar disc   . Thyroid disease     Past Surgical History:  Procedure Laterality Date  . DILATION AND CURETTAGE OF UTERUS      Social History   Socioeconomic History  . Marital status: Married    Spouse name: Not on file  . Number of children: Not on file  . Years of education: Not on file  . Highest education level: Not on file  Occupational History  . Not on file  Social Needs  . Financial resource strain: Not on file  . Food insecurity    Worry: Not on file    Inability: Not on file  . Transportation needs    Medical: Not on file    Non-medical: Not on file  Tobacco Use  . Smoking status: Former Research scientist (life sciences)  . Smokeless tobacco: Never Used  Substance and Sexual Activity  . Alcohol use: No    Alcohol/week: 0.0 standard drinks  . Drug  use: No  . Sexual activity: Not on file  Lifestyle  . Physical activity    Days per week: Not on file    Minutes per session: Not on file  . Stress: Not on file  Relationships  . Social Herbalist on phone: Not on file    Gets together: Not on file    Attends religious service: Not on file    Active member of club or organization: Not on file    Attends meetings of clubs or organizations: Not on file    Relationship status: Not on file  . Intimate partner violence    Fear of current or ex partner: Not on file    Emotionally abused: Not on file    Physically abused: Not on file    Forced sexual activity: Not on file  Other Topics Concern  . Not on file  Social History Narrative   Lives with husband in a 2 story home.  Has 1 child.     Education: high school.  Housewife.      Outpatient Encounter Medications as of 06/23/2019  Medication Sig  . BIOTIN PO Take by mouth.  . Black Cohosh 160 MG CAPS Take by mouth.  . cyclobenzaprine (FLEXERIL) 5 MG tablet Take 1 tablet (5 mg total) by mouth 3 (three) times daily as needed for muscle spasms.  Marland Kitchen  Docosahexaenoic Acid (DHA PO) Take by mouth.  . levothyroxine (SYNTHROID) 75 MCG tablet Take 1 tablet (75 mcg total) by mouth daily.  . Loratadine 10 MG CAPS Take by mouth.  . [DISCONTINUED] levothyroxine (SYNTHROID, LEVOTHROID) 75 MCG tablet Take 1 tablet (75 mcg total) by mouth daily.  . [DISCONTINUED] cetirizine (ZYRTEC) 10 MG tablet Take 1 tablet (10 mg total) by mouth daily.  . [DISCONTINUED] fluticasone (FLONASE) 50 MCG/ACT nasal spray Place 2 sprays into both nostrils daily.  . [DISCONTINUED] Garlic 1000 MG CAPS Take by mouth.   No facility-administered encounter medications on file as of 06/23/2019.     Allergies  Allergen Reactions  . Sulfur Rash    Family History, Prefers not to take    Review of Systems  Constitutional: Negative for activity change, appetite change, chills, diaphoresis, fatigue, fever, unexpected  weight change, weight gain and weight loss.  HENT: Negative for hoarse voice, trouble swallowing and voice change.   Eyes: Negative for photophobia and visual disturbance.  Respiratory: Negative for cough, chest tightness, shortness of breath and wheezing.   Cardiovascular: Negative for chest pain, palpitations and leg swelling.  Gastrointestinal: Negative for abdominal pain, constipation and diarrhea.  Endocrine: Negative for cold intolerance and heat intolerance.  Genitourinary: Negative for decreased urine volume and difficulty urinating.  Musculoskeletal: Negative for arthralgias and myalgias.  Neurological: Negative for dizziness, tremors, weakness, light-headedness and headaches.  Psychiatric/Behavioral: Negative for agitation, confusion, decreased concentration, dysphoric mood, hallucinations, self-injury, sleep disturbance and suicidal ideas. The patient is not nervous/anxious and is not hyperactive.   All other systems reviewed and are negative.       Objective:  BP 120/77   Pulse 81   Temp 99 F (37.2 C)   Ht 5' 4.5" (1.638 m)   Wt 135 lb (61.2 kg)   BMI 22.81 kg/m    Wt Readings from Last 3 Encounters:  06/23/19 135 lb (61.2 kg)  01/21/19 131 lb 12.8 oz (59.8 kg)  12/29/18 134 lb (60.8 kg)    Physical Exam Vitals signs and nursing note reviewed.  Constitutional:      General: She is not in acute distress.    Appearance: Normal appearance. She is well-developed and well-groomed. She is not ill-appearing, toxic-appearing or diaphoretic.  HENT:     Head: Normocephalic and atraumatic.     Jaw: There is normal jaw occlusion.     Right Ear: Hearing normal.     Left Ear: Hearing normal.     Nose: Nose normal.     Mouth/Throat:     Lips: Pink.     Mouth: Mucous membranes are moist.     Pharynx: Oropharynx is clear. Uvula midline.  Eyes:     General: Lids are normal.     Extraocular Movements: Extraocular movements intact.     Conjunctiva/sclera: Conjunctivae  normal.     Pupils: Pupils are equal, round, and reactive to light.  Neck:     Musculoskeletal: Normal range of motion and neck supple.     Thyroid: No thyroid mass, thyromegaly or thyroid tenderness.     Vascular: No carotid bruit or JVD.     Trachea: Trachea and phonation normal.  Cardiovascular:     Rate and Rhythm: Normal rate and regular rhythm.     Chest Wall: PMI is not displaced.     Pulses: Normal pulses.     Heart sounds: Normal heart sounds. No murmur. No friction rub. No gallop.   Pulmonary:     Effort:  Pulmonary effort is normal. No respiratory distress.     Breath sounds: Normal breath sounds. No wheezing.  Abdominal:     General: Bowel sounds are normal. There is no distension or abdominal bruit.     Palpations: Abdomen is soft. There is no hepatomegaly or splenomegaly.     Tenderness: There is no abdominal tenderness. There is no right CVA tenderness or left CVA tenderness.     Hernia: No hernia is present.  Musculoskeletal: Normal range of motion.     Right lower leg: No edema.     Left lower leg: No edema.  Lymphadenopathy:     Cervical: No cervical adenopathy.  Skin:    General: Skin is warm and dry.     Capillary Refill: Capillary refill takes less than 2 seconds.     Coloration: Skin is not cyanotic, jaundiced or pale.     Findings: No rash.  Neurological:     General: No focal deficit present.     Mental Status: She is alert and oriented to person, place, and time.     Cranial Nerves: Cranial nerves are intact.     Sensory: Sensation is intact.     Motor: Motor function is intact.     Coordination: Coordination is intact.     Gait: Gait is intact.     Deep Tendon Reflexes: Reflexes are normal and symmetric.  Psychiatric:        Attention and Perception: Attention and perception normal.        Mood and Affect: Mood and affect normal.        Speech: Speech normal.        Behavior: Behavior normal. Behavior is cooperative.        Thought Content: Thought  content normal.        Cognition and Memory: Cognition and memory normal.        Judgment: Judgment normal.     Results for orders placed or performed in visit on 12/29/18  TSH  Result Value Ref Range   TSH 1.250 0.450 - 4.500 uIU/mL       Pertinent labs & imaging results that were available during my care of the patient were reviewed by me and considered in my medical decision making.  Assessment & Plan:  Amanda Cloudamela was seen today for medical management of chronic issues and hypothyroidism.  Diagnoses and all orders for this visit:  Acquired hypothyroidism Thyroid disease has been well controlled. Labs are pending. Adjustments to regimen will be made if warranted. Make sure to take medications on an empty stomach with a full glass of water. Make sure to avoid vitamins or supplements for at least 4 hours before and 4 hours after taking medications. Repeat labs in 3 months if adjustments are made and in 6 months if stable.   -     Thyroid Panel With TSH -     levothyroxine (SYNTHROID) 75 MCG tablet; Take 1 tablet (75 mcg total) by mouth daily.  Screening for colorectal cancer Was doing yearly FOBT. Discussed cologuard in detail. Pt is not high risk. Will order cologuard.  -     Cologuard     Continue all other maintenance medications.  Follow up plan: Return in about 6 months (around 12/24/2019), or if symptoms worsen or fail to improve, for Thyroid.  Continue healthy lifestyle choices, including diet (rich in fruits, vegetables, and lean proteins, and low in salt and simple carbohydrates) and exercise (at least 30 minutes of moderate physical  activity daily).  Educational handout given for survey, COVID-19  The above assessment and management plan was discussed with the patient. The patient verbalized understanding of and has agreed to the management plan. Patient is aware to call the clinic if symptoms persist or worsen. Patient is aware when to return to the clinic for a  follow-up visit. Patient educated on when it is appropriate to go to the emergency department.   Kari BaarsMichelle Annmarie Plemmons, FNP-C [facility] [phone] 06/23/19

## 2019-06-23 NOTE — Patient Instructions (Signed)
It was a pleasure seeing you today, Delilah.  Information regarding what we discussed is included in this packet.  Please make an appointment to see me in 6 months.   In a few days you may receive a survey in the mail or online from [employer] regarding your visit with [place] today. Please take a moment to fill this out. Your feedback is very important to our office. It can help [place] better understand your needs as well as improve your experience and satisfaction. Thank you for taking your time to complete it. We care about you.  Because of recent events of COVID-19 ("Coronavirus"), please follow CDC recommendations:   1. Wash your hand frequently 2. Avoid touching your face 3. Stay away from people who are sick 4. If you have symptoms such as fever, cough, shortness of breath then call your healthcare provider for further guidance 5. If you are sick, STAY AT HOME, unless otherwise directed by your healthcare provider. 6. Follow directions from state and national officials regarding staying safe    Please feel free to call our office if any questions or concerns arise.  Warm Regards, Monia Pouch, FNP-C [facility] [address] [place], [place] [address] [phone]

## 2019-06-24 LAB — THYROID PANEL WITH TSH
Free Thyroxine Index: 2.2 (ref 1.2–4.9)
T3 Uptake Ratio: 26 % (ref 24–39)
T4, Total: 8.5 ug/dL (ref 4.5–12.0)
TSH: 1.13 u[IU]/mL (ref 0.450–4.500)

## 2019-08-31 ENCOUNTER — Other Ambulatory Visit: Payer: Self-pay

## 2019-09-01 ENCOUNTER — Ambulatory Visit (INDEPENDENT_AMBULATORY_CARE_PROVIDER_SITE_OTHER): Payer: BLUE CROSS/BLUE SHIELD | Admitting: Physician Assistant

## 2019-09-01 ENCOUNTER — Encounter: Payer: Self-pay | Admitting: Physician Assistant

## 2019-09-01 VITALS — BP 101/64 | HR 79 | Temp 98.7°F | Ht 64.5 in | Wt 136.2 lb

## 2019-09-01 DIAGNOSIS — L309 Dermatitis, unspecified: Secondary | ICD-10-CM | POA: Diagnosis not present

## 2019-09-01 DIAGNOSIS — Z0000 Encounter for general adult medical examination without abnormal findings: Secondary | ICD-10-CM | POA: Diagnosis not present

## 2019-09-01 DIAGNOSIS — Z23 Encounter for immunization: Secondary | ICD-10-CM | POA: Diagnosis not present

## 2019-09-01 DIAGNOSIS — Z0001 Encounter for general adult medical examination with abnormal findings: Secondary | ICD-10-CM | POA: Diagnosis not present

## 2019-09-01 MED ORDER — CLOBETASOL PROPIONATE 0.05 % EX OINT: 1.0000 "application " | TOPICAL_OINTMENT | Freq: Two times a day (BID) | CUTANEOUS | 0 refills | Status: DC

## 2019-09-01 NOTE — Patient Instructions (Signed)

## 2019-09-02 LAB — CBC WITH DIFFERENTIAL/PLATELET
Basophils Absolute: 0.1 10*3/uL (ref 0.0–0.2)
Basos: 1 %
EOS (ABSOLUTE): 0.1 10*3/uL (ref 0.0–0.4)
Eos: 1 %
Hematocrit: 38.2 % (ref 34.0–46.6)
Hemoglobin: 13.1 g/dL (ref 11.1–15.9)
Immature Grans (Abs): 0 10*3/uL (ref 0.0–0.1)
Immature Granulocytes: 0 %
Lymphocytes Absolute: 3.6 10*3/uL — ABNORMAL HIGH (ref 0.7–3.1)
Lymphs: 41 %
MCH: 32.4 pg (ref 26.6–33.0)
MCHC: 34.3 g/dL (ref 31.5–35.7)
MCV: 95 fL (ref 79–97)
Monocytes Absolute: 0.8 10*3/uL (ref 0.1–0.9)
Monocytes: 9 %
Neutrophils Absolute: 4.1 10*3/uL (ref 1.4–7.0)
Neutrophils: 48 %
Platelets: 274 10*3/uL (ref 150–450)
RBC: 4.04 x10E6/uL (ref 3.77–5.28)
RDW: 11.8 % (ref 11.7–15.4)
WBC: 8.7 10*3/uL (ref 3.4–10.8)

## 2019-09-02 LAB — CMP14+EGFR
ALT: 13 IU/L (ref 0–32)
AST: 17 IU/L (ref 0–40)
Albumin/Globulin Ratio: 1.5 (ref 1.2–2.2)
Albumin: 4.6 g/dL (ref 3.8–4.9)
Alkaline Phosphatase: 65 IU/L (ref 39–117)
BUN/Creatinine Ratio: 21 (ref 9–23)
BUN: 15 mg/dL (ref 6–24)
Bilirubin Total: <0.2 mg/dL (ref 0.0–1.2)
CO2: 22 mmol/L (ref 20–29)
Calcium: 9.7 mg/dL (ref 8.7–10.2)
Chloride: 103 mmol/L (ref 96–106)
Creatinine, Ser: 0.71 mg/dL (ref 0.57–1.00)
GFR calc Af Amer: 108 mL/min/{1.73_m2} (ref >59)
GFR calc non Af Amer: 94 mL/min/{1.73_m2} (ref >59)
Globulin, Total: 3.1 g/dL (ref 1.5–4.5)
Glucose: 104 mg/dL — ABNORMAL HIGH (ref 65–99)
Potassium: 4.1 mmol/L (ref 3.5–5.2)
Sodium: 138 mmol/L (ref 134–144)
Total Protein: 7.7 g/dL (ref 6.0–8.5)

## 2019-09-02 LAB — LIPID PANEL
Chol/HDL Ratio: 4.7 ratio — ABNORMAL HIGH (ref 0.0–4.4)
Cholesterol, Total: 262 mg/dL — ABNORMAL HIGH (ref 100–199)
HDL: 56 mg/dL (ref >39)
LDL Chol Calc (NIH): 183 mg/dL — ABNORMAL HIGH (ref 0–99)
Triglycerides: 129 mg/dL (ref 0–149)
VLDL Cholesterol Cal: 23 mg/dL (ref 5–40)

## 2019-09-02 LAB — TSH: TSH: 1.56 u[IU]/mL (ref 0.450–4.500)

## 2019-09-04 ENCOUNTER — Encounter: Payer: Self-pay | Admitting: Physician Assistant

## 2019-09-04 NOTE — Progress Notes (Signed)
BP 101/64   Pulse 79   Temp 98.7 F (37.1 C) (Temporal)   Ht 5' 4.5" (1.638 m)   Wt 136 lb 3.2 oz (61.8 kg)   LMP 08/01/2015   SpO2 97%   BMI 23.02 kg/m    Subjective:    Patient ID: Amanda Hardy, female    DOB: 02-07-1961, 58 y.o.   MRN: [id number]  HPI: Amanda Hardy is a 58 y.o. female presenting on 09/01/2019 for Annual Exam  This patient comes in for annual well physical examination. All medications are reviewed today. There are no reports of any problems with the medications. All of the medical conditions are reviewed and updated.  Lab work is reviewed and will be ordered as medically necessary. There are no new problems reported with today's visit.  Patient reports doing well overall.  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  Surgery was performed by Dr. Sherwood Gambler.  At this time she is fairly stable  She has hypothyroidism and has been taking levothyroxine for some time.  Allergic rhinitis.  Patient takes over-the-counter Zyrtec and is well controlled  She states that she has had a little bit of a rash on her leg for couple of months.  It has been a little bit of red and scaling.  She is only tried some triple antibiotic on it.  I do feel like it is more eczematous in nature and we will try a steroid cream.     Past Medical History:  Diagnosis Date  . Cervical disc disease   . History of multiple concussions    58 yo first time  . Ruptured lumbar disc   . Thyroid disease    Relevant past medical, surgical, family and social history reviewed and updated as indicated. Interim medical history since our last visit reviewed. Allergies and medications reviewed and updated. DATA REVIEWED: CHART IN EPIC  Family History reviewed for pertinent findings.  Review of Systems  Constitutional: Negative.  Negative for activity change, fatigue and fever.  HENT: Negative.   Eyes: Negative.   Respiratory:  Negative.  Negative for cough.   Cardiovascular: Negative.  Negative for chest pain.  Gastrointestinal: Negative.  Negative for abdominal pain.  Endocrine: Negative.   Genitourinary: Negative.  Negative for dysuria.  Musculoskeletal: Negative.   Skin: Positive for color change and rash.  Neurological: Negative.     Allergies as of 09/01/2019      Reactions   Sulfur Rash   Family History, Prefers not to take      Medication List       Accurate as of September 01, 2019 11:59 PM. If you have any questions, ask your nurse or doctor.        STOP taking these medications   Loratadine 10 MG Caps Stopped by: Terald Sleeper, PA-C     TAKE these medications   BIOTIN PO Take by mouth.   Black Cohosh 160 MG Caps Take by mouth.   cetirizine 10 MG tablet Commonly known as: ZYRTEC Take 10 mg by mouth daily.   clobetasol ointment 0.05 % Commonly known as: TEMOVATE Apply 1 application topically 2 (two) times daily. Started by: Terald Sleeper, PA-C   cyclobenzaprine 5 MG tablet Commonly known as: FLEXERIL Take 1 tablet (5 mg total) by mouth 3 (three) times daily as needed for muscle spasms.   DHA PO Take by mouth.   levothyroxine 75 MCG tablet  Commonly known as: SYNTHROID Take 1 tablet (75 mcg total) by mouth daily.          Objective:    BP 101/64   Pulse 79   Temp 98.7 F (37.1 C) (Temporal)   Ht 5' 4.5" (1.638 m)   Wt 136 lb 3.2 oz (61.8 kg)   LMP 08/01/2015   SpO2 97%   BMI 23.02 kg/m   Allergies  Allergen Reactions  . Sulfur Rash    Family History, Prefers not to take    Wt Readings from Last 3 Encounters:  09/01/19 136 lb 3.2 oz (61.8 kg)  06/23/19 135 lb (61.2 kg)  01/21/19 131 lb 12.8 oz (59.8 kg)    Physical Exam Constitutional:      Appearance: She is well-developed.  HENT:     Head: Normocephalic and atraumatic.     Right Ear: Tympanic membrane, ear canal and external ear normal.     Left Ear: Tympanic membrane, ear canal and external ear  normal.     Nose: Nose normal. No rhinorrhea.     Mouth/Throat:     Pharynx: No oropharyngeal exudate or posterior oropharyngeal erythema.  Eyes:     Conjunctiva/sclera: Conjunctivae normal.     Pupils: Pupils are equal, round, and reactive to light.  Neck:     Musculoskeletal: Normal range of motion and neck supple.  Cardiovascular:     Rate and Rhythm: Normal rate and regular rhythm.     Heart sounds: Normal heart sounds.  Pulmonary:     Effort: Pulmonary effort is normal.     Breath sounds: Normal breath sounds.  Abdominal:     Hardy: Bowel sounds are normal.     Palpations: Abdomen is soft.  Skin:    Hardy: Skin is warm and dry.     Findings: No rash.  Neurological:     Mental Status: She is alert and oriented to person, place, and time.     Deep Tendon Reflexes: Reflexes are normal and symmetric.  Psychiatric:        Behavior: Behavior normal.        Thought Content: Thought content normal.        Judgment: Judgment normal.     Results for orders placed or performed in visit on 09/01/19  CBC with Differential/Platelet  Result Value Ref Range   WBC 8.7 3.4 - 10.8 x10E3/uL   RBC 4.04 3.77 - 5.28 x10E6/uL   Hemoglobin 13.1 11.1 - 15.9 g/dL   Hematocrit 38.2 34.0 - 46.6 %   MCV 95 79 - 97 fL   MCH 32.4 26.6 - 33.0 pg   MCHC 34.3 31.5 - 35.7 g/dL   RDW 11.8 11.7 - 15.4 %   Platelets 274 150 - 450 x10E3/uL   Neutrophils 48 Not Estab. %   Lymphs 41 Not Estab. %   Monocytes 9 Not Estab. %   Eos 1 Not Estab. %   Basos 1 Not Estab. %   Neutrophils Absolute 4.1 1.4 - 7.0 x10E3/uL   Lymphocytes Absolute 3.6 (H) 0.7 - 3.1 x10E3/uL   Monocytes Absolute 0.8 0.1 - 0.9 x10E3/uL   EOS (ABSOLUTE) 0.1 0.0 - 0.4 x10E3/uL   Basophils Absolute 0.1 0.0 - 0.2 x10E3/uL   Immature Granulocytes 0 Not Estab. %   Immature Grans (Abs) 0.0 0.0 - 0.1 x10E3/uL  CMP14+EGFR  Result Value Ref Range   Glucose 104 (H) 65 - 99 mg/dL   BUN 15 6 - 24 mg/dL   Creatinine,  Ser 0.71 0.57 - 1.00  mg/dL   GFR calc non Af Amer 94 >59 mL/min/1.73   GFR calc Af Amer 108 >59 mL/min/1.73   BUN/Creatinine Ratio 21 9 - 23   Sodium 138 134 - 144 mmol/L   Potassium 4.1 3.5 - 5.2 mmol/L   Chloride 103 96 - 106 mmol/L   CO2 22 20 - 29 mmol/L   Calcium 9.7 8.7 - 10.2 mg/dL   Total Protein 7.7 6.0 - 8.5 g/dL   Albumin 4.6 3.8 - 4.9 g/dL   Globulin, Total 3.1 1.5 - 4.5 g/dL   Albumin/Globulin Ratio 1.5 1.2 - 2.2   Bilirubin Total <0.2 0.0 - 1.2 mg/dL   Alkaline Phosphatase 65 39 - 117 IU/L   AST 17 0 - 40 IU/L   ALT 13 0 - 32 IU/L  Lipid panel  Result Value Ref Range   Cholesterol, Total 262 (H) 100 - 199 mg/dL   Triglycerides 129 0 - 149 mg/dL   HDL 56 >39 mg/dL   VLDL Cholesterol Cal 23 5 - 40 mg/dL   LDL Chol Calc (NIH) 183 (H) 0 - 99 mg/dL   Chol/HDL Ratio 4.7 (H) 0.0 - 4.4 ratio  TSH  Result Value Ref Range   TSH 1.560 0.450 - 4.500 uIU/mL      Assessment & Plan:   1. Well adult exam - CBC with Differential/Platelet - CMP14+EGFR - Lipid panel - TSH  2. Need for immunization against influenza - Flu Vaccine QUAD 36+ mos IM  3. Eczema, unspecified type - clobetasol ointment (TEMOVATE) 0.05 %; Apply 1 application topically 2 (two) times daily.  Dispense: 30 g; Refill: 0   Continue all other maintenance medications as listed above.  Follow up plan: No follow-ups on file.  Educational handout given for health maintenance  Terald Sleeper PA-C [facility] [address]  [place], [place] [address] [phone]   09/04/2019, 5:28 PM

## 2019-09-05 ENCOUNTER — Other Ambulatory Visit: Payer: Self-pay | Admitting: Physician Assistant

## 2019-09-05 MED ORDER — ATORVASTATIN CALCIUM 10 MG PO TABS: 10.0000 mg | ORAL_TABLET | Freq: Every day | ORAL | 3 refills | Status: DC

## 2019-09-05 NOTE — Progress Notes (Signed)
simva

## 2019-09-07 ENCOUNTER — Telehealth: Payer: Self-pay | Admitting: Physician Assistant

## 2019-09-07 NOTE — Telephone Encounter (Signed)
Left message to call back  

## 2019-09-07 NOTE — Telephone Encounter (Signed)
Low fat diet, fish, chicken, mainly vegetables. Recheck in 2-3 months

## 2019-09-07 NOTE — Telephone Encounter (Signed)
Patient aware and verbalized understanding. °

## 2019-09-07 NOTE — Telephone Encounter (Signed)
Patient is unwilling to take any of the statins.  If there is something else she could do instead, she would be willing to.

## 2019-09-07 NOTE — Telephone Encounter (Signed)
And the notes found in the lab results, I said that I would be sending a statin and for her LDL of 183.  I was instructed to go ahead and send it.  And that is what Lipitor is.  Had she taken in the past?  And is there some other statin that she would want to take?

## 2019-09-07 NOTE — Telephone Encounter (Signed)
Please advise on medication

## 2019-09-12 DIAGNOSIS — Z1231 Encounter for screening mammogram for malignant neoplasm of breast: Secondary | ICD-10-CM | POA: Diagnosis not present

## 2019-12-06 ENCOUNTER — Ambulatory Visit: Payer: BLUE CROSS/BLUE SHIELD | Admitting: Physician Assistant

## 2019-12-15 ENCOUNTER — Other Ambulatory Visit: Payer: Self-pay

## 2019-12-18 ENCOUNTER — Encounter: Payer: Self-pay | Admitting: Physician Assistant

## 2019-12-18 ENCOUNTER — Other Ambulatory Visit: Payer: Self-pay

## 2019-12-18 ENCOUNTER — Ambulatory Visit (INDEPENDENT_AMBULATORY_CARE_PROVIDER_SITE_OTHER): Payer: BC Managed Care – PPO | Admitting: Physician Assistant

## 2019-12-18 VITALS — BP 135/83 | HR 78 | Temp 98.6°F | Ht 64.5 in | Wt 134.4 lb

## 2019-12-18 DIAGNOSIS — E7800 Pure hypercholesterolemia, unspecified: Secondary | ICD-10-CM

## 2019-12-18 DIAGNOSIS — E039 Hypothyroidism, unspecified: Secondary | ICD-10-CM

## 2019-12-18 MED ORDER — LEVOTHYROXINE SODIUM 75 MCG PO TABS
75.0000 ug | ORAL_TABLET | Freq: Every day | ORAL | 3 refills | Status: DC
Start: 2019-12-18 — End: 2020-03-18

## 2019-12-18 NOTE — Progress Notes (Signed)
Acute Office Visit  Subjective:    Patient ID: Amanda Hardy, female    DOB: Nov 23, 1960, 59 y.o.   MRN: [id number]  Chief Complaint  Patient presents with  . Hypothyroidism    3 month follow up    Thyroid Problem Presents for follow-up visit. Symptoms include fatigue. Patient reports no anxiety, cold intolerance, constipation, depressed mood, diaphoresis, hair loss, nail problem or tremors. The symptoms have been improving.     Past Medical History:  Diagnosis Date  . Cervical disc disease   . History of multiple concussions    59 yo first time  . Ruptured lumbar disc   . Thyroid disease     Past Surgical History:  Procedure Laterality Date  . DILATION AND CURETTAGE OF UTERUS      Family History  Problem Relation Age of Onset  . Hypertension Mother        Living, 45  . Thyroid disease Mother   . Dementia Father   . Cancer Father        prostate  . Thyroid disease Brother   . Thyroid disease Sister   . Healthy Daughter     Social History   Socioeconomic History  . Marital status: Married    Spouse name: Not on file  . Number of children: Not on file  . Years of education: Not on file  . Highest education level: Not on file  Occupational History  . Not on file  Tobacco Use  . Smoking status: Former Games developer  . Smokeless tobacco: Never Used  Substance and Sexual Activity  . Alcohol use: No    Alcohol/week: 0.0 standard drinks  . Drug use: No  . Sexual activity: Not on file  Other Topics Concern  . Not on file  Social History Narrative   Lives with husband in a 2 story home.  Has 1 child.     Education: high school.  Housewife.     Social Determinants of Health   Financial Resource Strain:   . Difficulty of Paying Living Expenses: Not on file  Food Insecurity:   . Worried About Programme researcher, broadcasting/film/video in the Last Year: Not on file  . Ran Out of Food in the Last Year: Not on file  Transportation Needs:   . Lack of Transportation (Medical):  Not on file  . Lack of Transportation (Non-Medical): Not on file  Physical Activity:   . Days of Exercise per Week: Not on file  . Minutes of Exercise per Session: Not on file  Stress:   . Feeling of Stress : Not on file  Social Connections:   . Frequency of Communication with Friends and Family: Not on file  . Frequency of Social Gatherings with Friends and Family: Not on file  . Attends Religious Services: Not on file  . Active Member of Clubs or Organizations: Not on file  . Attends Banker Meetings: Not on file  . Marital Status: Not on file  Intimate Partner Violence:   . Fear of Current or Ex-Partner: Not on file  . Emotionally Abused: Not on file  . Physically Abused: Not on file  . Sexually Abused: Not on file    Outpatient Medications Prior to Visit  Medication Sig Dispense Refill  . BIOTIN PO Take by mouth.    . Black Cohosh 160 MG CAPS Take by mouth.    . cetirizine (ZYRTEC) 10 MG tablet Take 10 mg by mouth daily.    Marland Kitchen  clobetasol ointment (TEMOVATE) 0.05 % Apply 1 application topically 2 (two) times daily. 30 g 0  . cyclobenzaprine (FLEXERIL) 5 MG tablet Take 1 tablet (5 mg total) by mouth 3 (three) times daily as needed for muscle spasms. 60 tablet 2  . Docosahexaenoic Acid (DHA PO) Take by mouth.    Marland Kitchen atorvastatin (LIPITOR) 10 MG tablet Take 1 tablet (10 mg total) by mouth daily. 90 tablet 3  . levothyroxine (SYNTHROID) 75 MCG tablet Take 1 tablet (75 mcg total) by mouth daily. 90 tablet 2   No facility-administered medications prior to visit.    Allergies  Allergen Reactions  . Sulfur Rash    Family History, Prefers not to take    Review of Systems  Constitutional: Positive for fatigue. Negative for diaphoresis.  HENT: Negative.   Eyes: Negative.   Respiratory: Negative.   Gastrointestinal: Negative.  Negative for constipation.  Endocrine: Negative for cold intolerance.  Genitourinary: Negative.   Neurological: Negative for tremors.    Psychiatric/Behavioral: The patient is not nervous/anxious.        Objective:    Physical Exam Constitutional:      General: She is not in acute distress.    Appearance: Normal appearance. She is well-developed.  HENT:     Head: Normocephalic and atraumatic.  Cardiovascular:     Rate and Rhythm: Normal rate.  Pulmonary:     Effort: Pulmonary effort is normal.  Skin:    General: Skin is warm and dry.     Findings: No rash.  Neurological:     Mental Status: She is alert and oriented to person, place, and time.     Deep Tendon Reflexes: Reflexes are normal and symmetric.     BP 135/83   Pulse 78   Temp 98.6 F (37 C) (Temporal)   Ht 5' 4.5" (1.638 m)   Wt 134 lb 6.4 oz (61 kg)   LMP 08/01/2015   SpO2 100%   BMI 22.71 kg/m  Wt Readings from Last 3 Encounters:  12/18/19 134 lb 6.4 oz (61 kg)  09/01/19 136 lb 3.2 oz (61.8 kg)  06/23/19 135 lb (61.2 kg)    There are no preventive care reminders to display for this patient.  There are no preventive care reminders to display for this patient.   Lab Results  Component Value Date   TSH 3.600 12/18/2019   Lab Results  Component Value Date   WBC 8.7 09/01/2019   HGB 13.1 09/01/2019   HCT 38.2 09/01/2019   MCV 95 09/01/2019   PLT 274 09/01/2019   Lab Results  Component Value Date   NA 138 09/01/2019   K 4.1 09/01/2019   CO2 22 09/01/2019   GLUCOSE 104 (H) 09/01/2019   BUN 15 09/01/2019   CREATININE 0.71 09/01/2019   BILITOT <0.2 09/01/2019   ALKPHOS 65 09/01/2019   AST 17 09/01/2019   ALT 13 09/01/2019   PROT 7.7 09/01/2019   ALBUMIN 4.6 09/01/2019   CALCIUM 9.7 09/01/2019   Lab Results  Component Value Date   CHOL 210 (H) 12/18/2019   Lab Results  Component Value Date   HDL 50 12/18/2019   Lab Results  Component Value Date   LDLCALC 136 (H) 12/18/2019   Lab Results  Component Value Date   TRIG 135 12/18/2019   Lab Results  Component Value Date   CHOLHDL 4.2 12/18/2019   Lab Results   Component Value Date   HGBA1C 5.8 07/09/2016  Assessment & Plan:   Problem List Items Addressed This Visit      Endocrine   Hypothyroidism   Relevant Medications   levothyroxine (SYNTHROID) 75 MCG tablet   Other Relevant Orders   Lipid panel (Completed)    Other Visit Diagnoses    Elevated LDL cholesterol level    -  Primary   Relevant Orders   TSH (Completed)       Meds ordered this encounter  Medications  . levothyroxine (SYNTHROID) 75 MCG tablet    Sig: Take 1 tablet (75 mcg total) by mouth daily.    Dispense:  90 tablet    Refill:  Barrett, PA-C   Terald Sleeper PA-C [facility] [address]  [place], [place] [address] [phone]

## 2019-12-19 LAB — LIPID PANEL
Chol/HDL Ratio: 4.2 ratio (ref 0.0–4.4)
Cholesterol, Total: 210 mg/dL — ABNORMAL HIGH (ref 100–199)
HDL: 50 mg/dL (ref >39)
LDL Chol Calc (NIH): 136 mg/dL — ABNORMAL HIGH (ref 0–99)
Triglycerides: 135 mg/dL (ref 0–149)
VLDL Cholesterol Cal: 24 mg/dL (ref 5–40)

## 2019-12-19 LAB — TSH: TSH: 3.6 u[IU]/mL (ref 0.450–4.500)

## 2020-02-10 DIAGNOSIS — Z23 Encounter for immunization: Secondary | ICD-10-CM | POA: Diagnosis not present

## 2020-03-18 ENCOUNTER — Other Ambulatory Visit: Payer: Self-pay | Admitting: *Deleted

## 2020-03-18 DIAGNOSIS — E039 Hypothyroidism, unspecified: Secondary | ICD-10-CM

## 2020-03-18 MED ORDER — LEVOTHYROXINE SODIUM 75 MCG PO TABS
75.0000 ug | ORAL_TABLET | Freq: Every day | ORAL | 2 refills | Status: DC
Start: 2020-03-18 — End: 2020-11-20

## 2020-09-02 ENCOUNTER — Other Ambulatory Visit: Payer: Self-pay | Admitting: Physician Assistant

## 2020-09-02 DIAGNOSIS — Z1231 Encounter for screening mammogram for malignant neoplasm of breast: Secondary | ICD-10-CM

## 2020-09-30 ENCOUNTER — Ambulatory Visit
Admission: RE | Admit: 2020-09-30 | Discharge: 2020-09-30 | Disposition: A | Discharge status: Home / Self Care | Payer: BLUE CROSS/BLUE SHIELD | Source: Ambulatory Visit | Attending: Allergy | Admitting: Allergy

## 2020-09-30 ENCOUNTER — Other Ambulatory Visit: Payer: Self-pay

## 2020-09-30 DIAGNOSIS — Z1231 Encounter for screening mammogram for malignant neoplasm of breast: Secondary | ICD-10-CM

## 2020-11-20 ENCOUNTER — Other Ambulatory Visit: Payer: Self-pay | Admitting: Family Medicine

## 2020-11-20 DIAGNOSIS — E039 Hypothyroidism, unspecified: Secondary | ICD-10-CM

## 2020-12-20 ENCOUNTER — Other Ambulatory Visit: Payer: Self-pay

## 2020-12-20 ENCOUNTER — Encounter: Payer: Self-pay | Admitting: Nurse Practitioner

## 2020-12-20 ENCOUNTER — Ambulatory Visit (INDEPENDENT_AMBULATORY_CARE_PROVIDER_SITE_OTHER): Payer: BLUE CROSS/BLUE SHIELD | Admitting: Nurse Practitioner

## 2020-12-20 VITALS — BP 116/69 | HR 86 | Temp 97.8°F | Ht 64.5 in | Wt 136.8 lb

## 2020-12-20 DIAGNOSIS — M546 Pain in thoracic spine: Secondary | ICD-10-CM

## 2020-12-20 DIAGNOSIS — G8929 Other chronic pain: Secondary | ICD-10-CM | POA: Insufficient documentation

## 2020-12-20 MED ORDER — PREDNISONE 10 MG (21) PO TBPK: ORAL_TABLET | ORAL | 0 refills | Status: DC

## 2020-12-20 MED ORDER — CYCLOBENZAPRINE HCL 5 MG PO TABS: 5.0000 mg | ORAL_TABLET | Freq: Three times a day (TID) | ORAL | 2 refills | Status: AC | PRN

## 2020-12-20 NOTE — Progress Notes (Signed)
Acute Office Visit  Subjective:    Patient ID: Amanda Hardy, female    DOB: 07-23-1961, 60 y.o.   MRN: [id number]  Chief Complaint  Patient presents with  . Back Pain    Back Pain This is a chronic problem. The current episode started more than 1 year ago. The problem occurs constantly. The problem has been gradually worsening since onset. The pain is severe. The symptoms are aggravated by bending, sitting and twisting. Stiffness is present all day. Associated symptoms include numbness, paresthesias and tingling. Pertinent negatives include no abdominal pain, bladder incontinence, fever or headaches. She has tried muscle relaxant for the symptoms. The treatment provided mild relief.     Past Medical History:  Diagnosis Date  . Cervical disc disease   . History of multiple concussions    60 yo first time  . Ruptured lumbar disc   . Thyroid disease     Past Surgical History:  Procedure Laterality Date  . DILATION AND CURETTAGE OF UTERUS      Family History  Problem Relation Age of Onset  . Hypertension Mother        Living, 73  . Thyroid disease Mother   . Dementia Father   . Cancer Father        prostate  . Thyroid disease Brother   . Thyroid disease Sister   . Healthy Daughter     Social History   Socioeconomic History  . Marital status: Married    Spouse name: Not on file  . Number of children: Not on file  . Years of education: Not on file  . Highest education level: Not on file  Occupational History  . Not on file  Tobacco Use  . Smoking status: Former Games developer  . Smokeless tobacco: Never Used  Vaping Use  . Vaping Use: Never used  Substance and Sexual Activity  . Alcohol use: No    Alcohol/week: 0.0 standard drinks  . Drug use: No  . Sexual activity: Not on file  Other Topics Concern  . Not on file  Social History Narrative   Lives with husband in a 2 story home.  Has 1 child.     Education: high school.  Housewife.     Social  Determinants of Health   Financial Resource Strain: Not on file  Food Insecurity: Not on file  Transportation Needs: Not on file  Physical Activity: Not on file  Stress: Not on file  Social Connections: Not on file  Intimate Partner Violence: Not on file    Outpatient Medications Prior to Visit  Medication Sig Dispense Refill  . BIOTIN PO Take by mouth.    . Black Cohosh 160 MG CAPS Take by mouth.    . cetirizine (ZYRTEC) 10 MG tablet Take 10 mg by mouth daily.    . clobetasol ointment (TEMOVATE) 0.05 % Apply 1 application topically 2 (two) times daily. 30 g 0  . Docosahexaenoic Acid (DHA PO) Take by mouth.    . levothyroxine (EUTHYROX) 75 MCG tablet Take 1 tablet (75 mcg total) by mouth daily. (Needs to be seen before next refill) 30 tablet 0  . cyclobenzaprine (FLEXERIL) 5 MG tablet Take 1 tablet (5 mg total) by mouth 3 (three) times daily as needed for muscle spasms. 60 tablet 2   No facility-administered medications prior to visit.    Allergies  Allergen Reactions  . Elemental Sulfur Rash    Family History, Prefers not to take  Review of Systems  Constitutional: Negative.  Negative for fever.  HENT: Negative.   Eyes: Negative.   Respiratory: Negative.   Cardiovascular: Negative.   Gastrointestinal: Negative for abdominal pain.  Genitourinary: Negative.  Negative for bladder incontinence.  Musculoskeletal: Positive for back pain.  Neurological: Positive for tingling, numbness and paresthesias. Negative for headaches.  All other systems reviewed and are negative.      Objective:    Physical Exam Vitals reviewed.  Constitutional:      Appearance: Normal appearance.  HENT:     Head: Normocephalic.     Nose: Nose normal.  Eyes:     Conjunctiva/sclera: Conjunctivae normal.  Cardiovascular:     Rate and Rhythm: Normal rate and regular rhythm.     Heart sounds: Normal heart sounds.  Pulmonary:     Effort: Pulmonary effort is normal.     Breath sounds: Normal  breath sounds.  Musculoskeletal:        General: Tenderness present.  Skin:    General: Skin is warm.  Neurological:     Mental Status: She is alert and oriented to person, place, and time.     Comments: Tingling, numbness  Psychiatric:        Behavior: Behavior normal.     BP 116/69   Pulse 86   Temp 97.8 F (36.6 C)   Ht 5' 4.5" (1.638 m)   Wt 136 lb 12.8 oz (62.1 kg)   LMP 08/01/2015   SpO2 97%   BMI 23.12 kg/m  Wt Readings from Last 3 Encounters:  12/20/20 136 lb 12.8 oz (62.1 kg)  12/18/19 134 lb 6.4 oz (61 kg)  09/01/19 136 lb 3.2 oz (61.8 kg)    Health Maintenance Due  Topic Date Due  . COVID-19 Vaccine (1) Never done  . HIV Screening  Never done  . COLONOSCOPY (Pts 45-76yrs Insurance coverage will need to be confirmed)  Never done  . COLON CANCER SCREENING ANNUAL FOBT  06/03/2018  . PAP SMEAR-Modifier  05/26/2020  . INFLUENZA VACCINE  06/02/2020    There are no preventive care reminders to display for this patient.   Lab Results  Component Value Date   TSH 3.600 12/18/2019   Lab Results  Component Value Date   WBC 8.7 09/01/2019   HGB 13.1 09/01/2019   HCT 38.2 09/01/2019   MCV 95 09/01/2019   PLT 274 09/01/2019   Lab Results  Component Value Date   NA 138 09/01/2019   K 4.1 09/01/2019   CO2 22 09/01/2019   GLUCOSE 104 (H) 09/01/2019   BUN 15 09/01/2019   CREATININE 0.71 09/01/2019   BILITOT <0.2 09/01/2019   ALKPHOS 65 09/01/2019   AST 17 09/01/2019   ALT 13 09/01/2019   PROT 7.7 09/01/2019   ALBUMIN 4.6 09/01/2019   CALCIUM 9.7 09/01/2019   Lab Results  Component Value Date   CHOL 210 (H) 12/18/2019   Lab Results  Component Value Date   HDL 50 12/18/2019   Lab Results  Component Value Date   LDLCALC 136 (H) 12/18/2019   Lab Results  Component Value Date   TRIG 135 12/18/2019   Lab Results  Component Value Date   CHOLHDL 4.2 12/18/2019   Lab Results  Component Value Date   HGBA1C 5.8 07/09/2016       Assessment &  Plan:   Problem List Items Addressed This Visit      Other   Chronic bilateral thoracic back pain - Primary  Patient is reporting acute back pain.  Symptoms are not new because patient has chronic back pain.  In the last few weeks patient is reporting tingling sensation and numbness bilateral lower extremities coming from previous history of chronic back pain.  The symptoms are new and worsening with twisting or bending.  Patient has consulted with orthopedic surgery in the past and prefers to hold off surgery for now. Flexeril 5 mg tablet 3 times daily as needed for muscle spasm refilled to pharmacy.  Prednisone taper ordered, patient does not want a Depo-Medrol shot in clinic today.  Advised patient to follow-up with worsening or unresolved symptoms.  Rx sent to pharmacy.      Relevant Medications   cyclobenzaprine (FLEXERIL) 5 MG tablet   predniSONE (STERAPRED UNI-PAK 21 TAB) 10 MG (21) TBPK tablet       Meds ordered this encounter  Medications  . cyclobenzaprine (FLEXERIL) 5 MG tablet    Sig: Take 1 tablet (5 mg total) by mouth 3 (three) times daily as needed for muscle spasms.    Dispense:  60 tablet    Refill:  2    Order Specific Question:   Supervising Provider    Answer:   Raliegh Ip [[id number]]  . predniSONE (STERAPRED UNI-PAK 21 TAB) 10 MG (21) TBPK tablet    Sig: 6 tablet day 1, 5 tab day 2, 4 tab day 3, 3 tab day 4, 2 tab day 5, 1 tab day 6    Dispense:  1 each    Refill:  0    Order Specific Question:   Supervising Provider    Answer:   Raliegh Ip [[id number]]     Daryll Drown, NP

## 2020-12-20 NOTE — Assessment & Plan Note (Addendum)
Patient is reporting acute back pain.  Symptoms are not new because patient has chronic back pain.  In the last few weeks patient is reporting tingling sensation and numbness bilateral lower extremities coming from previous history of chronic back pain.  The symptoms are new and worsening with twisting or bending.  Patient has consulted with orthopedic surgery in the past and prefers to hold off surgery for now. Flexeril 5 mg tablet 3 times daily as needed for muscle spasm refilled to pharmacy.  Prednisone taper ordered, patient does not want a Depo-Medrol shot in clinic today.  Advised patient to follow-up with worsening or unresolved symptoms.  Rx sent to pharmacy.

## 2020-12-20 NOTE — Patient Instructions (Signed)
https://doi.[email or web address]">  Chronic Back Pain When back pain lasts longer than 3 months, it is called chronic back pain. The cause of your back pain may not be known. Some common causes include:  Wear and tear (degenerative disease) of the bones, ligaments, or disks in your back.  Inflammation and stiffness in your back (arthritis). People who have chronic back pain often go through certain periods in which the pain is more intense (flare-ups). Many people can learn to manage the pain with home care. Follow these instructions at home: Pay attention to any changes in your symptoms. Take these actions to help with your pain: Managing pain and stiffness  If directed, apply ice to the painful area. Your health care provider may recommend applying ice during the first 24-48 hours after a flare-up begins. To do this: ? Put ice in a plastic bag. ? Place a towel between your skin and the bag. ? Leave the ice on for 20 minutes, 2-3 times per day.  If directed, apply heat to the affected area as often as told by your health care provider. Use the heat source that your health care provider recommends, such as a moist heat pack or a heating pad. ? Place a towel between your skin and the heat source. ? Leave the heat on for 20-30 minutes. ? Remove the heat if your skin turns bright red. This is especially important if you are unable to feel pain, heat, or cold. You may have a greater risk of getting burned.  Try soaking in a warm tub.      Activity  Avoid bending and other activities that make the problem worse.  Maintain a proper position when standing or sitting: ? When standing, keep your upper back and neck straight, with your shoulders pulled back. Avoid slouching. ? When sitting, keep your back straight and relax your shoulders. Do not round your shoulders or pull them backward.  Do not sit or stand in one place for long periods of time.  Take brief periods of rest  throughout the day. This will reduce your pain. Resting in a lying or standing position is usually better than sitting to rest.  When you are resting for longer periods, mix in some mild activity or stretching between periods of rest. This will help to prevent stiffness and pain.  Get regular exercise. Ask your health care provider what activities are safe for you.  Do not lift anything that is heavier than 10 lb (4.5 kg), or the limit that you are told, until your health care provider says that it is safe. Always use proper lifting technique, which includes: ? Bending your knees. ? Keeping the load close to your body. ? Avoiding twisting.  Sleep on a firm mattress in a comfortable position. Try lying on your side with your knees slightly bent. If you lie on your back, put a pillow under your knees.   Medicines  Treatment may include medicines for pain and inflammation taken by mouth or applied to the skin, prescription pain medicine, or muscle relaxants. Take over-the-counter and prescription medicines only as told by your health care provider.  Ask your health care provider if the medicine prescribed to you: ? Requires you to avoid driving or using machinery. ? Can cause constipation. You may need to take these actions to prevent or treat constipation:  Drink enough fluid to keep your urine pale yellow.  Take over-the-counter or prescription medicines.  Eat foods that are high in fiber, such as   beans, whole grains, and fresh fruits and vegetables.  Limit foods that are high in fat and processed sugars, such as fried or sweet foods. General instructions  Do not use any products that contain nicotine or tobacco, such as cigarettes, e-cigarettes, and chewing tobacco. If you need help quitting, ask your health care provider.  Keep all follow-up visits as told by your health care provider. This is important. Contact a health care provider if:  You have pain that is not relieved with  rest or medicine.  Your pain gets worse, or you have new pain.  You have a high fever.  You have rapid weight loss.  You have trouble doing your normal activities. Get help right away if:  You have weakness or numbness in one or both of your legs or feet.  You have trouble controlling your bladder or your bowels.  You have severe back pain and have any of the following: ? Nausea or vomiting. ? Pain in your abdomen. ? Shortness of breath or you faint. Summary  Chronic back pain is back pain that lasts longer than 3 months.  When a flare-up begins, apply ice to the painful area for the first 24-48 hours.  Apply a moist heat pad or use a heating pad on the painful area as directed by your health care provider.  When you are resting for longer periods, mix in some mild activity or stretching between periods of rest. This will help to prevent stiffness and pain. This information is not intended to replace advice given to you by your health care provider. Make sure you discuss any questions you have with your health care provider. Document Revised: 11/29/2019 Document Reviewed: 11/29/2019 Elsevier Patient Education  2021 Elsevier Inc.  

## 2022-01-07 ENCOUNTER — Ambulatory Visit: Payer: BLUE CROSS/BLUE SHIELD | Admitting: Nurse Practitioner

## 2022-01-30 ENCOUNTER — Encounter: Payer: Self-pay | Admitting: Nurse Practitioner

## 2022-01-30 ENCOUNTER — Ambulatory Visit (INDEPENDENT_AMBULATORY_CARE_PROVIDER_SITE_OTHER): Payer: 59 | Admitting: Nurse Practitioner

## 2022-01-30 VITALS — BP 123/77 | HR 82 | Temp 98.0°F | Ht 64.5 in | Wt 137.0 lb

## 2022-01-30 DIAGNOSIS — E785 Hyperlipidemia, unspecified: Secondary | ICD-10-CM | POA: Diagnosis not present

## 2022-01-30 DIAGNOSIS — R300 Dysuria: Secondary | ICD-10-CM | POA: Diagnosis not present

## 2022-01-30 DIAGNOSIS — E039 Hypothyroidism, unspecified: Secondary | ICD-10-CM

## 2022-01-30 LAB — URINALYSIS, ROUTINE W REFLEX MICROSCOPIC
Bilirubin, UA: NEGATIVE
Glucose, UA: NEGATIVE
Ketones, UA: NEGATIVE
Leukocytes,UA: NEGATIVE
Nitrite, UA: NEGATIVE
Protein,UA: NEGATIVE
Specific Gravity, UA: 1.01 (ref 1.005–1.030)
Urobilinogen, Ur: 0.2 mg/dL (ref 0.2–1.0)
pH, UA: 7 (ref 5.0–7.5)

## 2022-01-30 LAB — MICROSCOPIC EXAMINATION
Bacteria, UA: NONE SEEN
Epithelial Cells (non renal): NONE SEEN /hpf (ref 0–10)
RBC: NONE SEEN /hpf (ref 0–2)
Renal Epithel, UA: NONE SEEN /hpf
WBC, UA: NONE SEEN /hpf (ref 0–5)

## 2022-01-30 MED ORDER — PHENAZOPYRIDINE HCL 100 MG PO TABS: 100.0000 mg | ORAL_TABLET | Freq: Three times a day (TID) | ORAL | 0 refills | Status: DC | PRN

## 2022-01-30 NOTE — Patient Instructions (Signed)
Dysuria ?Dysuria is pain or discomfort during urination. The pain or discomfort may be felt in the part of the body that drains urine from the bladder (urethra) or in the surrounding tissue of the genitals. The pain may also be felt in the groin area, lower abdomen, or lower back. ?You may have to urinate frequently or have the sudden feeling that you have to urinate (urgency). Dysuria can affect anyone, but it is more common in females. Dysuria can be caused by many different things, including: ?Urinary tract infection. ?Kidney stones or bladder stones. ?Certain STIs (sexually transmitted infections), such as chlamydia. ?Dehydration. ?Inflammation of the tissues of the vagina. ?Use of certain medicines. ?Use of certain soaps or scented products that cause irritation. ?Follow these instructions at home: ?Medicines ?Take over-the-counter and prescription medicines only as told by your health care provider. ?If you were prescribed an antibiotic medicine, take it as told by your health care provider. Do not stop taking the antibiotic even if you start to feel better. ?Eating and drinking ? ?Drink enough fluid to keep your urine pale yellow. ?Avoid caffeinated beverages, tea, and alcohol. These beverages can irritate the bladder and make dysuria worse. In males, alcohol may irritate the prostate. ?General instructions ?Watch your condition for any changes. ?Urinate often. Avoid holding urine for long periods of time. ?If you are female, you should wipe from front to back after urinating or having a bowel movement. Use each piece of toilet paper only once. ?Empty your bladder after sex. ?Keep all follow-up visits. This is important. ?If you had any tests done to find the cause of dysuria, it is up to you to get your test results. Ask your health care provider, or the department that is doing the test, when your results will be ready. ?Contact a health care provider if: ?You have a fever. ?You develop pain in your back or  sides. ?You have nausea or vomiting. ?You have blood in your urine. ?You are not urinating as often as you usually do. ?Get help right away if: ?Your pain is severe and not relieved with medicines. ?You cannot eat or drink without vomiting. ?You are confused. ?You have a rapid heartbeat while resting. ?You have shaking or chills. ?You feel extremely weak. ?Summary ?Dysuria is pain or discomfort while urinating. Many different conditions can lead to dysuria. ?If you have dysuria, you may have to urinate frequently or have the sudden feeling that you have to urinate (urgency). ?Watch your condition for any changes. Keep all follow-up visits. ?Make sure that you urinate often and drink enough fluid to keep your urine pale yellow. ?This information is not intended to replace advice given to you by your health care provider. Make sure you discuss any questions you have with your health care provider. ?Document Revised: 05/31/2020 Document Reviewed: 05/31/2020 ?Elsevier Patient Education ? 2022 Elsevier Inc. ? ?

## 2022-01-30 NOTE — Assessment & Plan Note (Signed)
Continue current medication, completed lipid panels with results pending ?

## 2022-01-30 NOTE — Progress Notes (Signed)
? ?Established Patient Office Visit ? ?Subjective:  ?Patient ID: Amanda Hardy, female    DOB: 10-Jul-1961  Age: 61 y.o. MRN: [id number] ? ?CC:  ?Chief Complaint  ?Patient presents with  ? chronic disease management  ? ? ?HPI ?Amanda Hardy presents for Thyroid: Patient presents for evaluation of hypothyroidism. Current symptoms include fatigue. Patient denies anxiousness, feeling excessive energy, tremulousness, palpitations.   ? ?Mixed hyperlipidemia  ?Pt presents with hyperlipidemia. Patient was diagnosed in 07/26/2013. Compliance with treatment has been good; The patient is compliant with medications, maintains a low cholesterol diet , follows up as directed , and maintains an exercise regimen . The patient denies experiencing any hypercholesterolemia related symptoms.   ? ? ?Dysuria  ?This is a new problem. The current episode started yesterday. The problem occurs intermittently. The problem has been unchanged. The quality of the pain is described as aching. The pain is at a severity of 0/10. There has been no fever. Associated symptoms include frequency and urgency. Pertinent negatives include no chills, discharge, flank pain, hematuria, hesitancy or vomiting. She has tried nothing for the symptoms.   ? ?Past Medical History:  ?Diagnosis Date  ? Cervical disc disease   ? History of multiple concussions   ? 61 yo first time  ? Ruptured lumbar disc   ? Thyroid disease   ? ? ?Past Surgical History:  ?Procedure Laterality Date  ? DILATION AND CURETTAGE OF UTERUS    ? ? ?Family History  ?Problem Relation Age of Onset  ? Hypertension Mother   ?     Living, 82  ? Thyroid disease Mother   ? Dementia Father   ? Cancer Father   ?     prostate  ? Thyroid disease Brother   ? Thyroid disease Sister   ? Healthy Daughter   ? ? ?Social History  ? ?Socioeconomic History  ? Marital status: Married  ?  Spouse name: Not on file  ? Number of children: Not on file  ? Years of education: Not on file  ? Highest  education level: Not on file  ?Occupational History  ? Not on file  ?Tobacco Use  ? Smoking status: Former  ? Smokeless tobacco: Never  ?Vaping Use  ? Vaping Use: Never used  ?Substance and Sexual Activity  ? Alcohol use: No  ?  Alcohol/week: 0.0 standard drinks  ? Drug use: No  ? Sexual activity: Not on file  ?Other Topics Concern  ? Not on file  ?Social History Narrative  ? Lives with husband in a 2 story home.  Has 1 child.    ? Education: high school.  Housewife.    ? ?Social Determinants of Health  ? ?Financial Resource Strain: Not on file  ?Food Insecurity: Not on file  ?Transportation Needs: Not on file  ?Physical Activity: Not on file  ?Stress: Not on file  ?Social Connections: Not on file  ?Intimate Partner Violence: Not on file  ? ? ?Outpatient Medications Prior to Visit  ?Medication Sig Dispense Refill  ? BIOTIN PO Take by mouth.    ? Black Cohosh 160 MG CAPS Take by mouth.    ? cetirizine (ZYRTEC) 10 MG tablet Take 10 mg by mouth daily.    ? clobetasol ointment (TEMOVATE) 0.05 % Apply 1 application topically 2 (two) times daily. 30 g 0  ? cyclobenzaprine (FLEXERIL) 5 MG tablet Take 1 tablet (5 mg total) by mouth 3 (three) times daily as needed for muscle spasms.  60 tablet 2  ? Docosahexaenoic Acid (DHA PO) Take by mouth.    ? levothyroxine (EUTHYROX) 75 MCG tablet Take 1 tablet (75 mcg total) by mouth daily. (Needs to be seen before next refill) 30 tablet 0  ? predniSONE (STERAPRED UNI-PAK 21 TAB) 10 MG (21) TBPK tablet 6 tablet day 1, 5 tab day 2, 4 tab day 3, 3 tab day 4, 2 tab day 5, 1 tab day 6 1 each 0  ? ?No facility-administered medications prior to visit.  ? ? ?Allergies  ?Allergen Reactions  ? Elemental Sulfur Rash  ?  Family History, Prefers not to take  ? ? ?ROS ?Review of Systems  ?Constitutional:  Negative for chills.  ?HENT: Negative.    ?Respiratory: Negative.    ?Cardiovascular: Negative.   ?Gastrointestinal:  Negative for vomiting.  ?Genitourinary:  Positive for dysuria, frequency and  urgency. Negative for flank pain, hematuria and hesitancy.  ?All other systems reviewed and are negative. ? ?  ?Objective:  ?  ?Physical Exam ?Vitals and nursing note reviewed.  ?Constitutional:   ?   Appearance: Normal appearance.  ?HENT:  ?   Head: Normocephalic.  ?   Right Ear: External ear normal.  ?   Left Ear: External ear normal.  ?   Nose: Nose normal.  ?   Mouth/Throat:  ?   Mouth: Mucous membranes are moist.  ?   Pharynx: Oropharynx is clear.  ?Eyes:  ?   Conjunctiva/sclera: Conjunctivae normal.  ?Cardiovascular:  ?   Rate and Rhythm: Normal rate and regular rhythm.  ?   Pulses: Normal pulses.  ?   Heart sounds: Normal heart sounds.  ?Pulmonary:  ?   Effort: Pulmonary effort is normal.  ?   Breath sounds: Normal breath sounds.  ?Abdominal:  ?   General: Bowel sounds are normal. There is no distension.  ?   Tenderness: There is no abdominal tenderness. There is no right CVA tenderness, left CVA tenderness or guarding.  ?Skin: ?   General: Skin is warm.  ?   Findings: No rash.  ?Neurological:  ?   General: No focal deficit present.  ?   Mental Status: She is alert and oriented to person, place, and time.  ?Psychiatric:     ?   Mood and Affect: Mood normal.     ?   Behavior: Behavior normal.  ? ? ?BP 123/77   Pulse 82   Temp 98 ?F (36.7 ?C)   Ht 5' 4.5" (1.638 m)   Wt 137 lb (62.1 kg)   LMP 08/01/2015   SpO2 99%   BMI 23.15 kg/m?  ?Wt Readings from Last 3 Encounters:  ?01/30/22 137 lb (62.1 kg)  ?12/20/20 136 lb 12.8 oz (62.1 kg)  ?12/18/19 134 lb 6.4 oz (61 kg)  ? ? ? ?Health Maintenance Due  ?Topic Date Due  ? HIV Screening  Never done  ? COLONOSCOPY (Pts 45-19yrs Insurance coverage will need to be confirmed)  Never done  ? COLON CANCER SCREENING ANNUAL FOBT  06/03/2018  ? PAP SMEAR-Modifier  05/26/2020  ? COVID-19 Vaccine (3 - Booster for Janssen series) 12/11/2020  ? INFLUENZA VACCINE  06/02/2021  ? MAMMOGRAM  09/30/2021  ? Zoster Vaccines- Shingrix (2 of 2) 10/03/2021  ? ? ?There are no preventive  care reminders to display for this patient. ? ?Lab Results  ?Component Value Date  ? TSH 3.600 12/18/2019  ? ?Lab Results  ?Component Value Date  ? WBC 8.7 09/01/2019  ?  HGB 13.1 09/01/2019  ? HCT 38.2 09/01/2019  ? MCV 95 09/01/2019  ? PLT 274 09/01/2019  ? ?Lab Results  ?Component Value Date  ? NA 138 09/01/2019  ? K 4.1 09/01/2019  ? CO2 22 09/01/2019  ? GLUCOSE 104 (H) 09/01/2019  ? BUN 15 09/01/2019  ? CREATININE 0.71 09/01/2019  ? BILITOT <0.2 09/01/2019  ? ALKPHOS 65 09/01/2019  ? AST 17 09/01/2019  ? ALT 13 09/01/2019  ? PROT 7.7 09/01/2019  ? ALBUMIN 4.6 09/01/2019  ? CALCIUM 9.7 09/01/2019  ? ?Lab Results  ?Component Value Date  ? CHOL 210 (H) 12/18/2019  ? ?Lab Results  ?Component Value Date  ? HDL 50 12/18/2019  ? ?Lab Results  ?Component Value Date  ? LDLCALC 136 (H) 12/18/2019  ? ?Lab Results  ?Component Value Date  ? TRIG 135 12/18/2019  ? ?Lab Results  ?Component Value Date  ? CHOLHDL 4.2 12/18/2019  ? ?Lab Results  ?Component Value Date  ? HGBA1C 5.8 07/09/2016  ? ? ?  ?Assessment & Plan:  ? ?UTI  vs  interstitial cystitis ?-urinalysis completed results pending ?-pyridium for pain ?Precaution and education provided ?-All questions answered ?-follow up with unresolved symptoms  ? ?Problem List Items Addressed This Visit   ? ?  ? Endocrine  ? Hypothyroidism - Primary  ?  No new or worsening symptoms, completed TSH results pending ?  ?  ? Relevant Orders  ? TSH  ?  ? Other  ? Hyperlipidemia LDL goal <130  ?  Continue current medication, completed lipid panels with results pending ?  ?  ? Relevant Orders  ? CBC with Differential  ? Comprehensive metabolic panel  ? Lipid Panel  ? ?Other Visit Diagnoses   ? ? Dysuria      ? Relevant Medications  ? phenazopyridine (PYRIDIUM) 100 MG tablet  ? Other Relevant Orders  ? Urinalysis, Routine w reflex microscopic  ? CULTURE, URINE COMPREHENSIVE  ? ?  ? ? ?Meds ordered this encounter  ?Medications  ? phenazopyridine (PYRIDIUM) 100 MG tablet  ?  Sig: Take 1 tablet  (100 mg total) by mouth 3 (three) times daily as needed for pain.  ?  Dispense:  10 tablet  ?  Refill:  0  ?  Order Specific Question:   Supervising Provider  ?  AnswerMechele Claude:   STACKS, WARREN [[id number]]  ? ? ?Follow-up:

## 2022-01-30 NOTE — Assessment & Plan Note (Signed)
No new or worsening symptoms, completed TSH results pending ?

## 2022-01-31 LAB — CBC WITH DIFFERENTIAL/PLATELET
Basophils Absolute: 0.1 10*3/uL (ref 0.0–0.2)
Basos: 1 %
EOS (ABSOLUTE): 0.1 10*3/uL (ref 0.0–0.4)
Eos: 2 %
Hematocrit: 38.8 % (ref 34.0–46.6)
Hemoglobin: 13.3 g/dL (ref 11.1–15.9)
Immature Grans (Abs): 0 10*3/uL (ref 0.0–0.1)
Immature Granulocytes: 0 %
Lymphocytes Absolute: 3.4 10*3/uL — ABNORMAL HIGH (ref 0.7–3.1)
Lymphs: 46 %
MCH: 32.8 pg (ref 26.6–33.0)
MCHC: 34.3 g/dL (ref 31.5–35.7)
MCV: 96 fL (ref 79–97)
Monocytes Absolute: 0.6 10*3/uL (ref 0.1–0.9)
Monocytes: 9 %
Neutrophils Absolute: 3.1 10*3/uL (ref 1.4–7.0)
Neutrophils: 42 %
Platelets: 267 10*3/uL (ref 150–450)
RBC: 4.06 x10E6/uL (ref 3.77–5.28)
RDW: 11.5 % — ABNORMAL LOW (ref 11.7–15.4)
WBC: 7.3 10*3/uL (ref 3.4–10.8)

## 2022-01-31 LAB — TSH: TSH: 1.37 u[IU]/mL (ref 0.450–4.500)

## 2022-01-31 LAB — COMPREHENSIVE METABOLIC PANEL
ALT: 20 IU/L (ref 0–32)
AST: 21 IU/L (ref 0–40)
Albumin/Globulin Ratio: 2 (ref 1.2–2.2)
Albumin: 4.7 g/dL (ref 3.8–4.8)
Alkaline Phosphatase: 48 IU/L (ref 44–121)
BUN/Creatinine Ratio: 12 (ref 12–28)
BUN: 8 mg/dL (ref 8–27)
Bilirubin Total: 0.2 mg/dL (ref 0.0–1.2)
CO2: 24 mmol/L (ref 20–29)
Calcium: 9.9 mg/dL (ref 8.7–10.3)
Chloride: 101 mmol/L (ref 96–106)
Creatinine, Ser: 0.65 mg/dL (ref 0.57–1.00)
Globulin, Total: 2.4 g/dL (ref 1.5–4.5)
Glucose: 99 mg/dL (ref 70–99)
Potassium: 4.2 mmol/L (ref 3.5–5.2)
Sodium: 138 mmol/L (ref 134–144)
Total Protein: 7.1 g/dL (ref 6.0–8.5)
eGFR: 99 mL/min/{1.73_m2} (ref >59)

## 2022-01-31 LAB — LIPID PANEL
Chol/HDL Ratio: 2.2 ratio (ref 0.0–4.4)
Cholesterol, Total: 129 mg/dL (ref 100–199)
HDL: 58 mg/dL (ref >39)
LDL Chol Calc (NIH): 49 mg/dL (ref 0–99)
Triglycerides: 129 mg/dL (ref 0–149)
VLDL Cholesterol Cal: 22 mg/dL (ref 5–40)

## 2022-02-03 ENCOUNTER — Encounter: Payer: Self-pay | Admitting: Nurse Practitioner

## 2022-02-03 ENCOUNTER — Ambulatory Visit (INDEPENDENT_AMBULATORY_CARE_PROVIDER_SITE_OTHER): Payer: 59

## 2022-02-03 ENCOUNTER — Ambulatory Visit (INDEPENDENT_AMBULATORY_CARE_PROVIDER_SITE_OTHER): Payer: 59 | Admitting: Nurse Practitioner

## 2022-02-03 ENCOUNTER — Other Ambulatory Visit: Payer: Self-pay | Admitting: *Deleted

## 2022-02-03 VITALS — BP 111/70 | HR 89 | Temp 98.1°F | Ht 64.5 in | Wt 137.0 lb

## 2022-02-03 DIAGNOSIS — R300 Dysuria: Secondary | ICD-10-CM | POA: Diagnosis not present

## 2022-02-03 DIAGNOSIS — E785 Hyperlipidemia, unspecified: Secondary | ICD-10-CM

## 2022-02-03 DIAGNOSIS — R109 Unspecified abdominal pain: Secondary | ICD-10-CM | POA: Diagnosis not present

## 2022-02-03 DIAGNOSIS — E039 Hypothyroidism, unspecified: Secondary | ICD-10-CM

## 2022-02-03 LAB — URINALYSIS, ROUTINE W REFLEX MICROSCOPIC
Bilirubin, UA: NEGATIVE
Glucose, UA: NEGATIVE
Ketones, UA: NEGATIVE
Leukocytes,UA: NEGATIVE
Nitrite, UA: NEGATIVE
Protein,UA: NEGATIVE
Specific Gravity, UA: <1.005 — ABNORMAL LOW (ref 1.005–1.030)
Urobilinogen, Ur: 0.2 mg/dL (ref 0.2–1.0)
pH, UA: 6 (ref 5.0–7.5)

## 2022-02-03 LAB — MICROSCOPIC EXAMINATION
Bacteria, UA: NONE SEEN
Epithelial Cells (non renal): NONE SEEN /hpf (ref 0–10)
RBC, Urine: NONE SEEN /hpf (ref 0–2)
Renal Epithel, UA: NONE SEEN /hpf
WBC, UA: NONE SEEN /hpf (ref 0–5)

## 2022-02-03 MED ORDER — CEPHALEXIN 500 MG PO CAPS: 500.0000 mg | ORAL_CAPSULE | Freq: Two times a day (BID) | ORAL | 0 refills | Status: DC

## 2022-02-03 MED ORDER — ROSUVASTATIN CALCIUM 5 MG PO TABS: 5.0000 mg | ORAL_TABLET | Freq: Every day | ORAL | 3 refills | Status: DC

## 2022-02-03 NOTE — Patient Instructions (Signed)
Dysuria ?Dysuria is pain or discomfort during urination. The pain or discomfort may be felt in the part of the body that drains urine from the bladder (urethra) or in the surrounding tissue of the genitals. The pain may also be felt in the groin area, lower abdomen, or lower back. ?You may have to urinate frequently or have the sudden feeling that you have to urinate (urgency). Dysuria can affect anyone, but it is more common in females. Dysuria can be caused by many different things, including: ?Urinary tract infection. ?Kidney stones or bladder stones. ?Certain STIs (sexually transmitted infections), such as chlamydia. ?Dehydration. ?Inflammation of the tissues of the vagina. ?Use of certain medicines. ?Use of certain soaps or scented products that cause irritation. ?Follow these instructions at home: ?Medicines ?Take over-the-counter and prescription medicines only as told by your health care provider. ?If you were prescribed an antibiotic medicine, take it as told by your health care provider. Do not stop taking the antibiotic even if you start to feel better. ?Eating and drinking ? ?Drink enough fluid to keep your urine pale yellow. ?Avoid caffeinated beverages, tea, and alcohol. These beverages can irritate the bladder and make dysuria worse. In males, alcohol may irritate the prostate. ?General instructions ?Watch your condition for any changes. ?Urinate often. Avoid holding urine for long periods of time. ?If you are female, you should wipe from front to back after urinating or having a bowel movement. Use each piece of toilet paper only once. ?Empty your bladder after sex. ?Keep all follow-up visits. This is important. ?If you had any tests done to find the cause of dysuria, it is up to you to get your test results. Ask your health care provider, or the department that is doing the test, when your results will be ready. ?Contact a health care provider if: ?You have a fever. ?You develop pain in your back or  sides. ?You have nausea or vomiting. ?You have blood in your urine. ?You are not urinating as often as you usually do. ?Get help right away if: ?Your pain is severe and not relieved with medicines. ?You cannot eat or drink without vomiting. ?You are confused. ?You have a rapid heartbeat while resting. ?You have shaking or chills. ?You feel extremely weak. ?Summary ?Dysuria is pain or discomfort while urinating. Many different conditions can lead to dysuria. ?If you have dysuria, you may have to urinate frequently or have the sudden feeling that you have to urinate (urgency). ?Watch your condition for any changes. Keep all follow-up visits. ?Make sure that you urinate often and drink enough fluid to keep your urine pale yellow. ?This information is not intended to replace advice given to you by your health care provider. Make sure you discuss any questions you have with your health care provider. ?Document Revised: 05/31/2020 Document Reviewed: 05/31/2020 ?Elsevier Patient Education ? 2022 Elsevier Inc. ? ?

## 2022-02-03 NOTE — Progress Notes (Signed)
? ?Acute Office Visit ? ?Subjective:  ? ? Patient ID: Amanda Hardy, female    DOB: 06/13/1961, 61 y.o.   MRN: [id number] ? ?Chief Complaint  ?Patient presents with  ? Urinary Tract Infection  ?  Back pain, tender on the front side  ? ? ?Urinary Tract Infection  ?This is a recurrent problem. The current episode started in the past 7 days. The problem occurs intermittently. The problem has been unchanged. The quality of the pain is described as aching. The pain is moderate. There has been no fever. Associated symptoms include flank pain. Pertinent negatives include no chills, discharge or nausea. Treatments tried: azo. The treatment provided no relief.  ? ? ?Past Medical History:  ?Diagnosis Date  ? Cervical disc disease   ? History of multiple concussions   ? 61 yo first time  ? Ruptured lumbar disc   ? Thyroid disease   ? ? ?Past Surgical History:  ?Procedure Laterality Date  ? DILATION AND CURETTAGE OF UTERUS    ? ? ?Family History  ?Problem Relation Age of Onset  ? Hypertension Mother   ?     Living, 82  ? Thyroid disease Mother   ? Dementia Father   ? Cancer Father   ?     prostate  ? Thyroid disease Brother   ? Thyroid disease Sister   ? Healthy Daughter   ? ? ?Social History  ? ?Socioeconomic History  ? Marital status: Married  ?  Spouse name: Not on file  ? Number of children: Not on file  ? Years of education: Not on file  ? Highest education level: Not on file  ?Occupational History  ? Not on file  ?Tobacco Use  ? Smoking status: Former  ? Smokeless tobacco: Never  ?Vaping Use  ? Vaping Use: Never used  ?Substance and Sexual Activity  ? Alcohol use: No  ?  Alcohol/week: 0.0 standard drinks  ? Drug use: No  ? Sexual activity: Not on file  ?Other Topics Concern  ? Not on file  ?Social History Narrative  ? Lives with husband in a 2 story home.  Has 1 child.    ? Education: high school.  Housewife.    ? ?Social Determinants of Health  ? ?Financial Resource Strain: Not on file  ?Food Insecurity: Not on  file  ?Transportation Needs: Not on file  ?Physical Activity: Not on file  ?Stress: Not on file  ?Social Connections: Not on file  ?Intimate Partner Violence: Not on file  ? ? ?Outpatient Medications Prior to Visit  ?Medication Sig Dispense Refill  ? BIOTIN PO Take by mouth.    ? Black Cohosh 160 MG CAPS Take by mouth.    ? cetirizine (ZYRTEC) 10 MG tablet Take 10 mg by mouth daily.    ? clobetasol ointment (TEMOVATE) 2.77 % Apply 1 application topically 2 (two) times daily. 30 g 0  ? cyclobenzaprine (FLEXERIL) 5 MG tablet Take 1 tablet (5 mg total) by mouth 3 (three) times daily as needed for muscle spasms. 60 tablet 2  ? Docosahexaenoic Acid (DHA PO) Take by mouth.    ? levothyroxine (EUTHYROX) 75 MCG tablet Take 1 tablet (75 mcg total) by mouth daily. (Needs to be seen before next refill) 30 tablet 0  ? Omega 3-6-9 Fatty Acids (OMEGA DHA PO) Take 2,000 mg by mouth.    ? phenazopyridine (PYRIDIUM) 100 MG tablet Take 1 tablet (100 mg total) by mouth 3 (three) times daily  as needed for pain. 10 tablet 0  ? rosuvastatin (CRESTOR) 5 MG tablet Take 1 tablet by mouth daily.    ? ?No facility-administered medications prior to visit.  ? ? ?Allergies  ?Allergen Reactions  ? Elemental Sulfur Rash  ?  Family History, Prefers not to take  ? ? ?Review of Systems  ?Constitutional:  Negative for chills.  ?HENT: Negative.    ?Eyes: Negative.   ?Respiratory: Negative.    ?Gastrointestinal: Negative.  Negative for nausea.  ?Genitourinary:  Positive for flank pain.  ?Skin:  Negative for rash.  ?Psychiatric/Behavioral: Negative.    ?All other systems reviewed and are negative. ? ?   ?Objective:  ?  ?Physical Exam ?Vitals and nursing note reviewed.  ?Constitutional:   ?   Appearance: Normal appearance.  ?HENT:  ?   Head: Normocephalic and atraumatic.  ?   Right Ear: External ear normal.  ?   Left Ear: External ear normal.  ?   Mouth/Throat:  ?   Mouth: Mucous membranes are moist.  ?   Pharynx: Oropharynx is clear.  ?Eyes:  ?    Conjunctiva/sclera: Conjunctivae normal.  ?Cardiovascular:  ?   Rate and Rhythm: Normal rate and regular rhythm.  ?Pulmonary:  ?   Effort: Pulmonary effort is normal.  ?   Breath sounds: Normal breath sounds.  ?Abdominal:  ?   General: Bowel sounds are normal.  ?   Tenderness: There is right CVA tenderness.  ?Musculoskeletal:     ?   General: Normal range of motion.  ?Skin: ?   General: Skin is warm.  ?   Findings: No rash.  ?Neurological:  ?   General: No focal deficit present.  ?   Mental Status: She is alert and oriented to person, place, and time.  ?Psychiatric:     ?   Behavior: Behavior normal.  ? ? ?BP 111/70   Pulse 89   Temp 98.1 ?F (36.7 ?C)   Ht 5' 4.5" (1.638 m)   Wt 137 lb (62.1 kg)   LMP 08/01/2015   SpO2 96%   BMI 23.15 kg/m?  ?Wt Readings from Last 3 Encounters:  ?02/03/22 137 lb (62.1 kg)  ?01/30/22 137 lb (62.1 kg)  ?12/20/20 136 lb 12.8 oz (62.1 kg)  ? ? ?Health Maintenance Due  ?Topic Date Due  ? HIV Screening  Never done  ? COLONOSCOPY (Pts 45-52yr Insurance coverage will need to be confirmed)  Never done  ? COLON CANCER SCREENING ANNUAL FOBT  06/03/2018  ? PAP SMEAR-Modifier  05/26/2020  ? COVID-19 Vaccine (3 - Booster for Janssen series) 12/11/2020  ? MAMMOGRAM  09/30/2021  ? Zoster Vaccines- Shingrix (2 of 2) 10/03/2021  ? ? ?There are no preventive care reminders to display for this patient. ? ? ?Lab Results  ?Component Value Date  ? TSH 1.370 01/30/2022  ? ?Lab Results  ?Component Value Date  ? WBC 7.3 01/30/2022  ? HGB 13.3 01/30/2022  ? HCT 38.8 01/30/2022  ? MCV 96 01/30/2022  ? PLT 267 01/30/2022  ? ?Lab Results  ?Component Value Date  ? NA 138 01/30/2022  ? K 4.2 01/30/2022  ? CO2 24 01/30/2022  ? GLUCOSE 99 01/30/2022  ? BUN 8 01/30/2022  ? CREATININE 0.65 01/30/2022  ? BILITOT 0.2 01/30/2022  ? ALKPHOS 48 01/30/2022  ? AST 21 01/30/2022  ? ALT 20 01/30/2022  ? PROT 7.1 01/30/2022  ? ALBUMIN 4.7 01/30/2022  ? CALCIUM 9.9 01/30/2022  ? EGFR 99 01/30/2022  ? ?  Lab Results   ?Component Value Date  ? CHOL 129 01/30/2022  ? ?Lab Results  ?Component Value Date  ? HDL 58 01/30/2022  ? ?Lab Results  ?Component Value Date  ? LDLCALC 49 01/30/2022  ? ?Lab Results  ?Component Value Date  ? TRIG 129 01/30/2022  ? ?Lab Results  ?Component Value Date  ? CHOLHDL 2.2 01/30/2022  ? ?Lab Results  ?Component Value Date  ? HGBA1C 5.8 07/09/2016  ? ? ?   ?Assessment & Plan:  ?Unresolved dysuria in the past 7 days. ?UTI  vs  interstitial cystitis ?-urinalysis completed results pending ?-pyridium for pain ?-Keflex ?-Urinalysis and culture pending ?Precaution and education provided ?-All questions answered ?-follow up with unresolved symptoms  ?Problem List Items Addressed This Visit   ? ?  ? Other  ? Hyperlipidemia LDL goal <130  ? Relevant Medications  ? rosuvastatin (CRESTOR) 5 MG tablet  ? ?Other Visit Diagnoses   ? ? Dysuria    -  Primary  ? Relevant Medications  ? cephALEXin (KEFLEX) 500 MG capsule  ? Other Relevant Orders  ? DG Abd 1 View  ? Urinalysis, Routine w reflex microscopic  ? CULTURE, URINE COMPREHENSIVE  ? ?  ? ? ? ?Meds ordered this encounter  ?Medications  ? cephALEXin (KEFLEX) 500 MG capsule  ?  Sig: Take 1 capsule (500 mg total) by mouth 2 (two) times daily.  ?  Dispense:  14 capsule  ?  Refill:  0  ?  Order Specific Question:   Supervising Provider  ?  AnswerClaretta Fraise [[id number]]  ? rosuvastatin (CRESTOR) 5 MG tablet  ?  Sig: Take 1 tablet (5 mg total) by mouth daily.  ?  Dispense:  90 tablet  ?  Refill:  3  ?  Order Specific Question:   Supervising Provider  ?  AnswerClaretta Fraise [[id number]]  ? ? ? ?Ivy Lynn, NP ? ?

## 2022-02-04 ENCOUNTER — Other Ambulatory Visit: Payer: Self-pay | Admitting: Nurse Practitioner

## 2022-02-04 DIAGNOSIS — R300 Dysuria: Secondary | ICD-10-CM

## 2022-02-04 LAB — CULTURE, URINE COMPREHENSIVE

## 2022-02-04 MED ORDER — NITROFURANTOIN MONOHYD MACRO 100 MG PO CAPS: 100.0000 mg | ORAL_CAPSULE | Freq: Two times a day (BID) | ORAL | 0 refills | Status: DC

## 2022-02-07 LAB — CULTURE, URINE COMPREHENSIVE

## 2022-02-08 IMAGING — MG DIGITAL SCREENING BILAT W/ TOMO W/ CAD
8 series · 8 of 24 positions shown · non-contrast
Comparison: Previous exam(s).

CLINICAL DATA: Screening.

EXAM:
DIGITAL SCREENING BILATERAL MAMMOGRAM WITH TOMO AND CAD

[L CC synth-2D]
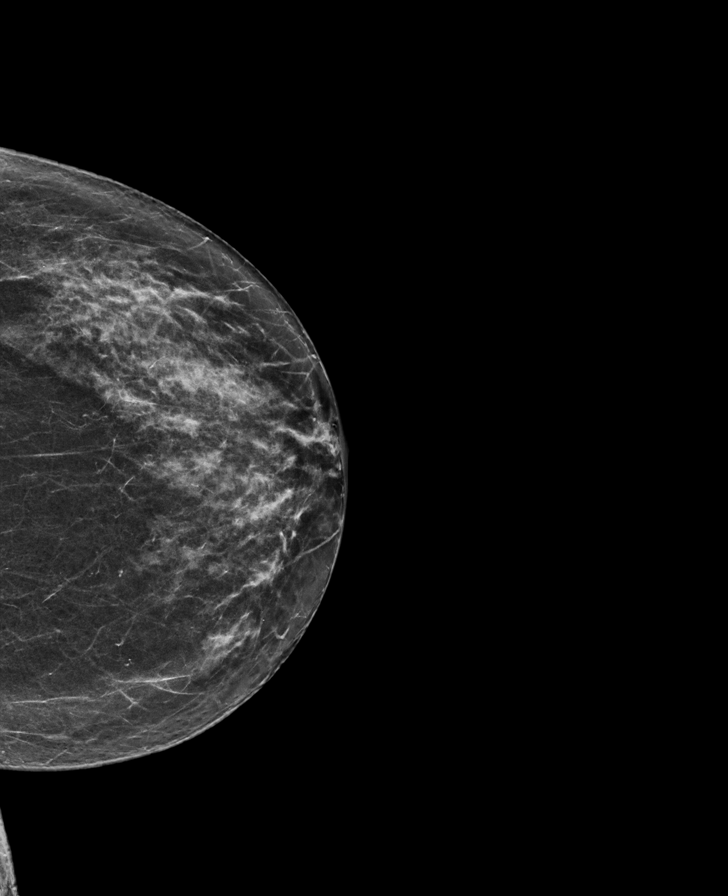

[R MLO synth-2D]
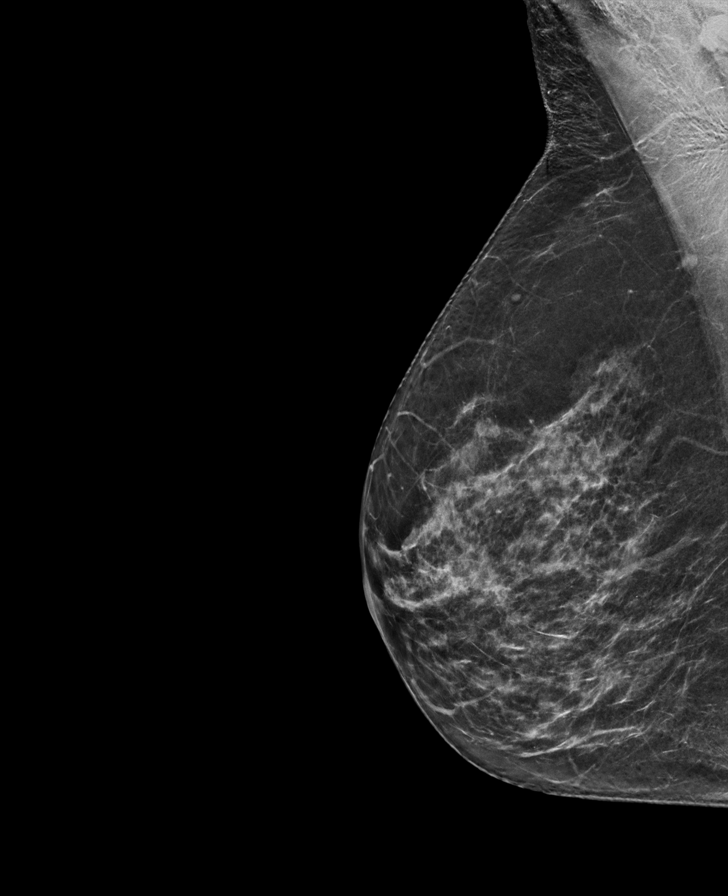

[L MLO synth-2D]
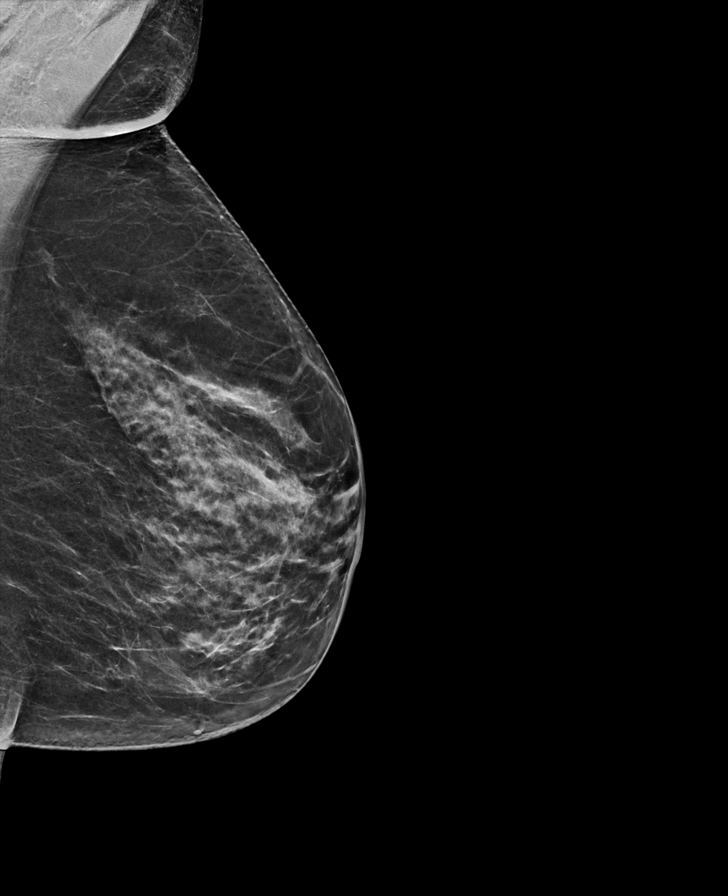

[R CC synth-2D]
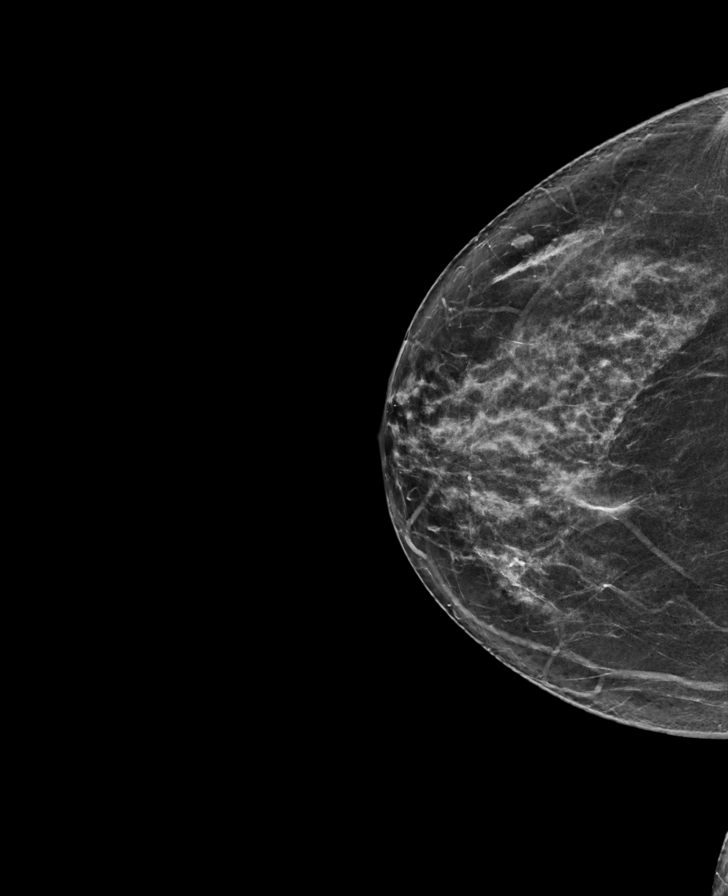

[L MLO tomo · tomo slice 35/68.0]
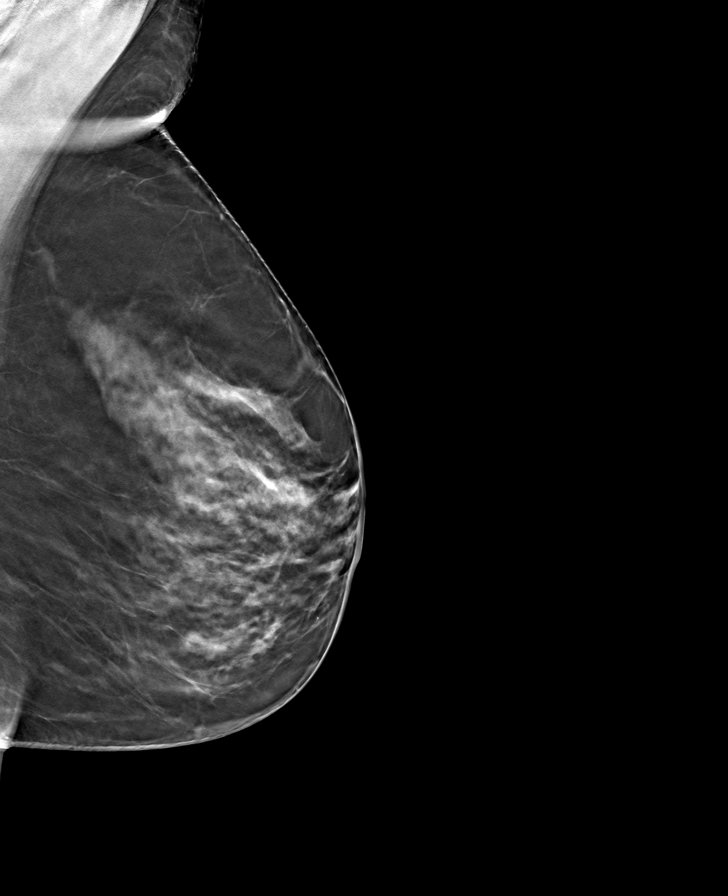

[R CC tomo · tomo slice 37/72.0]
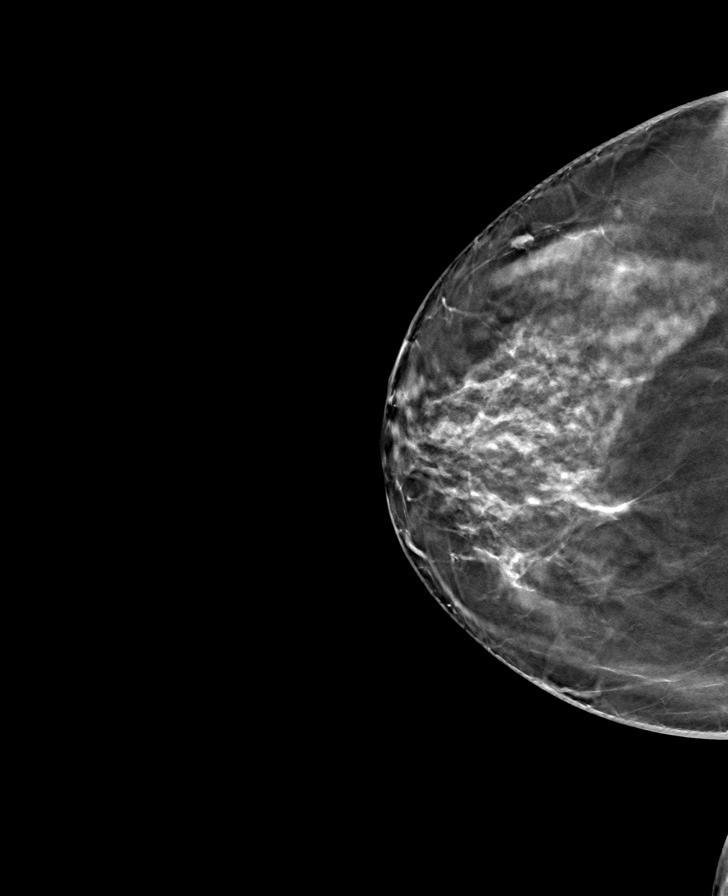

[R MLO tomo · tomo slice 34/67.0]
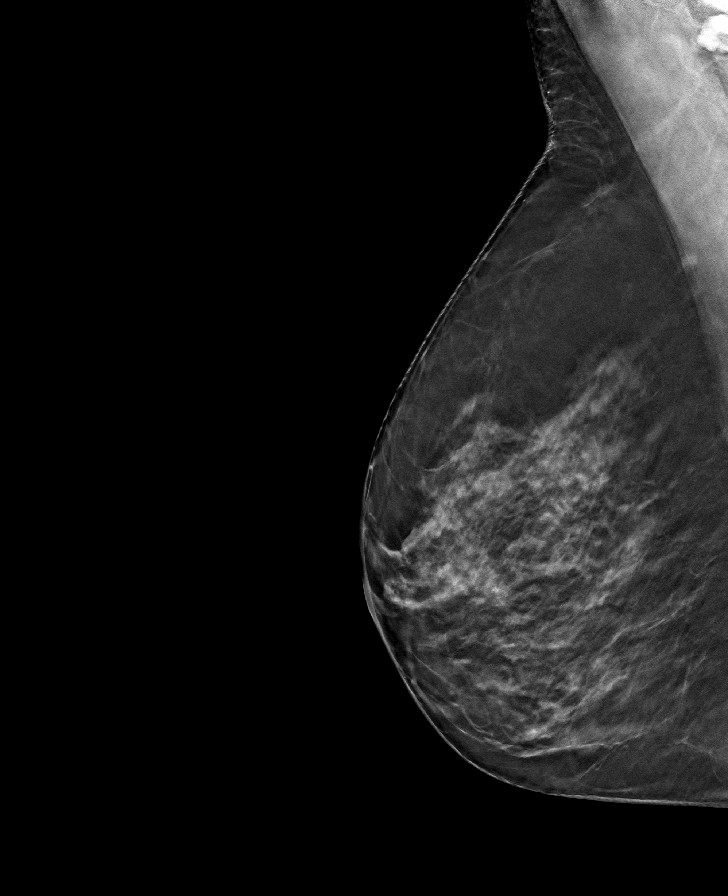

[L CC tomo · tomo slice 33/66.0]
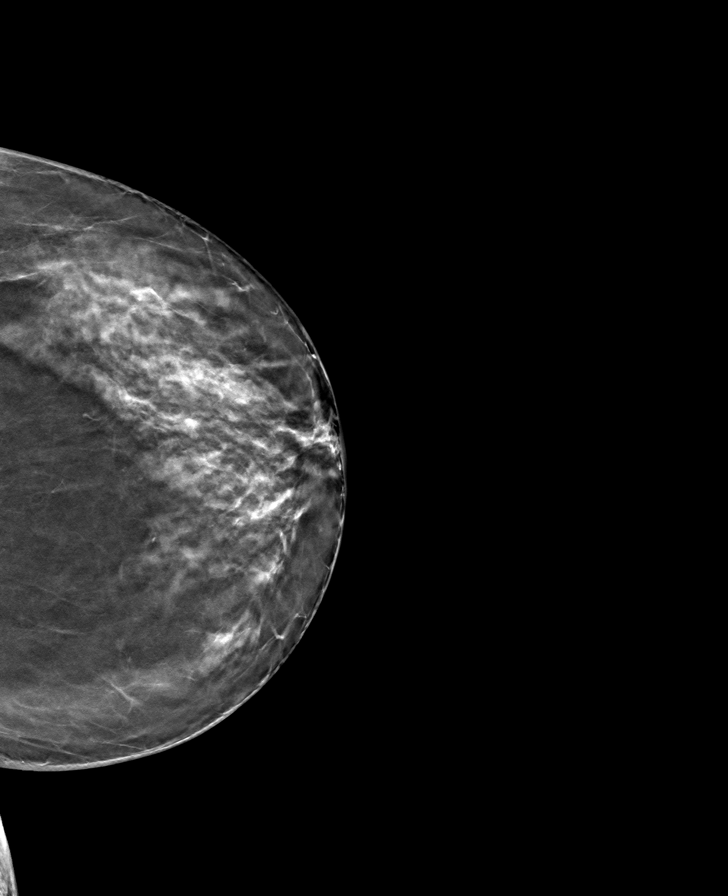

[8 of 24 positions shown; findings below may reference images not displayed]

ACR Breast Density Category c: The breast tissue is heterogeneously
dense, which may obscure small masses.
FINDINGS: There are no findings suspicious for malignancy. Images were
processed with CAD.
IMPRESSION: No mammographic evidence of malignancy. A result letter of this
screening mammogram will be mailed directly to the patient.

RECOMMENDATION:
Screening mammogram in one year. (Code:[id number])

BI-RADS CATEGORY  1: Negative.

## 2022-02-10 ENCOUNTER — Telehealth: Payer: Self-pay | Admitting: Nurse Practitioner

## 2022-02-10 ENCOUNTER — Other Ambulatory Visit: Payer: Self-pay | Admitting: Nurse Practitioner

## 2022-02-10 MED ORDER — PENICILLIN V POTASSIUM 500 MG PO TABS: 500.0000 mg | ORAL_TABLET | Freq: Three times a day (TID) | ORAL | 0 refills | Status: AC

## 2022-02-10 NOTE — Telephone Encounter (Signed)
Per lab result Urine culture is positive for UTI.  Discontinue Macrobid.  Penicillin sent to pharmacy.  Follow-up with unresolved symptoms. ?

## 2022-02-10 NOTE — Telephone Encounter (Signed)
Patient informed. 

## 2022-02-10 NOTE — Telephone Encounter (Signed)
Medication sent to pharmacy  

## 2022-03-23 ENCOUNTER — Ambulatory Visit (INDEPENDENT_AMBULATORY_CARE_PROVIDER_SITE_OTHER): Payer: 59 | Admitting: Nurse Practitioner

## 2022-03-23 ENCOUNTER — Encounter: Payer: Self-pay | Admitting: Nurse Practitioner

## 2022-03-23 VITALS — BP 123/80 | HR 78 | Ht 64.0 in | Wt 138.0 lb

## 2022-03-23 DIAGNOSIS — R109 Unspecified abdominal pain: Secondary | ICD-10-CM | POA: Diagnosis not present

## 2022-03-23 NOTE — Progress Notes (Signed)
Acute Office Visit  Subjective:     Patient ID: Amanda Hardy, female    DOB: [birth year], 61 y.o.   MRN: [id number]  Chief Complaint  Patient presents with   Back Pain    Pt states the pain is still there     Flank Pain This is a recurrent problem. The current episode started in the past 7 days. The problem occurs constantly. The problem is unchanged. The quality of the pain is described as aching. The pain is at a severity of 3/10. The pain is mild. The pain is The same all the time. The symptoms are aggravated by position. Stiffness is present All day. Pertinent negatives include no abdominal pain, bladder incontinence, bowel incontinence, fever, numbness or paresis. She has tried nothing for the symptoms.    Review of Systems  Constitutional:  Negative for fever.  HENT: Negative.    Respiratory: Negative.    Cardiovascular: Negative.   Gastrointestinal:  Negative for abdominal pain and bowel incontinence.  Genitourinary:  Positive for flank pain. Negative for bladder incontinence.  Skin: Negative.  Negative for rash.  Neurological:  Negative for numbness.  All other systems reviewed and are negative.      Objective:    BP 123/80   Pulse 78   Ht 5\' 4"  (1.626 m)   Wt 138 lb (62.6 kg)   LMP 08/01/2015   SpO2 97%   BMI 23.69 kg/m  BP Readings from Last 3 Encounters:  03/23/22 123/80  02/03/22 111/70  01/30/22 123/77   Wt Readings from Last 3 Encounters:  03/23/22 138 lb (62.6 kg)  02/03/22 137 lb (62.1 kg)  01/30/22 137 lb (62.1 kg)      Physical Exam Vitals and nursing note reviewed.  Constitutional:      Appearance: Normal appearance.  HENT:     Head: Normocephalic.     Right Ear: External ear normal.     Left Ear: External ear normal.     Nose: Nose normal.     Mouth/Throat:     Mouth: Mucous membranes are moist.     Pharynx: Oropharynx is clear.  Eyes:     Conjunctiva/sclera: Conjunctivae normal.  Cardiovascular:     Rate and Rhythm:  Normal rate and regular rhythm.     Pulses: Normal pulses.     Heart sounds: Normal heart sounds.  Pulmonary:     Effort: Pulmonary effort is normal.  Abdominal:     General: Bowel sounds are normal. There is no distension.     Palpations: Abdomen is soft.     Tenderness: There is no abdominal tenderness. There is right CVA tenderness and left CVA tenderness.     Comments: Bilateral cva tenderness right side greater than left  Neurological:     Mental Status: She is alert.    No results found for any visits on 03/23/22.      Assessment & Plan:   On going bilateral flank pain with right side greater than left. Patient previously assessed for uti with urinalysis completed Tylenol /ibuprofen for pain as needed -Kidney Ultra sound completed with results pending Follow up with worsening unresolved symptoms  Problem List Items Addressed This Visit   None Visit Diagnoses     Acute right flank pain    -  Primary   Relevant Orders   03/25/22 Renal       No orders of the defined types were placed in this encounter.   Return if symptoms worsen  or fail to improve.  Daryll Drown, NP

## 2022-03-23 NOTE — Patient Instructions (Signed)
Flank Pain, Adult ?Flank pain is pain in your side. The flank is the area on your side between your upper belly (abdomen) and your spine. The pain may occur over a short time (acute), or it may be long-term or come back often (chronic). It may be mild or very bad. Pain in this area can be caused by many different things. ?Follow these instructions at home: ? ?Drink enough fluid to keep your pee (urine) pale yellow. ?Rest as told by your doctor. ?Take over-the-counter and prescription medicines only as told by your doctor. ?Keep a journal to keep track of: ?What has caused your flank pain. ?What has made your flank pain feel better. ?Keep all follow-up visits. ?Contact a doctor if: ?Medicine does not help your pain. ?You have new symptoms. ?Your pain gets worse. ?Your symptoms last longer than 2-3 days. ?You have trouble peeing. ?You are peeing more often than normal. ?Get help right away if: ?You have trouble breathing. ?You are short of breath. ?Your belly hurts, or it is swollen or red. ?You feel like you may vomit (nauseous). ?You vomit. ?You feel faint, or you faint. ?You have blood in your pee. ?You have flank pain and a fever. ?These symptoms may be an emergency. Get help right away. Call your local emergency services (911 in the [place] ?Do not wait to see if the symptoms will go away. ?Do not drive yourself to the hospital. ?Summary ?Flank pain is pain in your side. The flank is the area of your side between your upper belly (abdomen) and your spine. ?Flank pain may occur over a short time (acute), or it may be long-term or come back often (chronic). It may be mild or very bad. ?Pain in this area can be caused by many different things. ?Contact your doctor if your symptoms get worse or last longer than 2-3 days. ?This information is not intended to replace advice given to you by your health care provider. Make sure you discuss any questions you have with your health care provider. ?Document Revised:  12/30/2020 Document Reviewed: 12/30/2020 ?Elsevier Patient Education ? 2023 [employer] ? ?

## 2022-04-03 ENCOUNTER — Ambulatory Visit (HOSPITAL_COMMUNITY)
Admission: RE | Admit: 2022-04-03 | Discharge: 2022-04-03 | Disposition: A | Discharge status: Home / Self Care | Payer: 59 | Source: Ambulatory Visit | Attending: Nurse Practitioner | Admitting: Nurse Practitioner

## 2022-04-03 DIAGNOSIS — R109 Unspecified abdominal pain: Secondary | ICD-10-CM | POA: Diagnosis not present

## 2022-05-06 ENCOUNTER — Encounter: Payer: Self-pay | Admitting: Nurse Practitioner

## 2022-05-06 ENCOUNTER — Ambulatory Visit (INDEPENDENT_AMBULATORY_CARE_PROVIDER_SITE_OTHER): Payer: 59 | Admitting: Nurse Practitioner

## 2022-05-06 VITALS — BP 113/60 | HR 84 | Temp 98.5°F | Ht 64.0 in | Wt 138.0 lb

## 2022-05-06 DIAGNOSIS — R531 Weakness: Secondary | ICD-10-CM

## 2022-05-06 DIAGNOSIS — R5383 Other fatigue: Secondary | ICD-10-CM | POA: Diagnosis not present

## 2022-05-06 DIAGNOSIS — E039 Hypothyroidism, unspecified: Secondary | ICD-10-CM | POA: Diagnosis not present

## 2022-05-06 MED ORDER — LEVOTHYROXINE SODIUM 75 MCG PO TABS: 75.0000 ug | ORAL_TABLET | Freq: Every day | ORAL | 3 refills | Status: DC

## 2022-05-06 NOTE — Patient Instructions (Addendum)
Our records indicate that you are due for your annual mammogram/breast imaging. While there is no way to prevent breast cancer, early detection provides the best opportunity for curing it. For women over the age of 46, the American Cancer Society recommends a yearly clinical breast exam and a yearly mammogram. These practices have saved thousands of lives. We need your help to ensure that you are receiving optimal medical care. Please call the imaging location that has done you previous mammograms. Please remember to list [place] as your primary care. This helps make sure we receive a report and can update your chart.  Below is the contact information for several local breast imaging centers. You may call the location that works best for you, and they will be happy to assistance in making you an appointment. You do not need an order for a regular screening mammogram. However, if you are having any problems or concerns with you breast area, please let your primary care provider know, and appropriate orders will be placed. Please let our office know if you have any questions or concerns. Or if you need information for another imaging center not on this list or outside of the area. We are commented to working with you on your health care journey.   The mobile unit/bus ([facility]) - they come twice a month to our location.  These appointments can be made through our office or by call The [facility]  [facility]  [address] [place], [place] [address] Phone [phone]  [facility] Radiology Department  [address] [place], [place] [address] [phone]  [facility] (part of [facility])  [phone] [address], [place] [address] [phone]  [facility] - [facility]  [address] [place] [address] [phone]  [facility] - [facility]  [address], [address] [place] [address] [phone]  [facility]ammography [facility] [place]  [address] [place], [place] [address] [phone]  [facility] [address] [facility] [address] [place], [place] [address] [phone]  [facility] [address]  [address] [place], [place] [address] [phone]  [facility] [place], [place] [address] [phone]   Hypothyroidism  Hypothyroidism is when the thyroid gland does not make enough of certain hormones. This is called an underactive thyroid. The thyroid gland is a small gland located in the lower front part of the neck, just in front of the windpipe (trachea). This gland makes hormones that help control how the body uses food for energy (metabolism) as well as how the heart and brain function. These hormones also play a role in keeping your bones strong. When the thyroid is underactive, it produces too little of the hormones thyroxine (T4) and triiodothyronine (T3). What are the causes? This condition may be caused by: Hashimoto's disease. This is a disease in which the body's disease-fighting system (immune system) attacks the thyroid gland. This is the most common cause. Viral infections. Pregnancy. Certain medicines. Birth defects. Problems with a gland in the center of the brain (pituitary gland). Lack of enough iodine in the diet. Other causes may include: Past radiation treatments to the head or neck for cancer. Past treatment with radioactive iodine. Past exposure to radiation in the environment.  Past surgical removal of part or all of the thyroid. What increases the risk? You are more likely to develop this condition if: You are female. You have a family history of thyroid conditions. You use a medicine called lithium. You take medicines that affect the immune system (immunosuppressants). What  are the signs or symptoms? Common symptoms of this condition include: Not being able to tolerate cold. Feeling as though you have no energy (lethargy). Lack of appetite. Constipation. Sadness or depression. Weight gain that is not explained by a change in diet or exercise habits. Menstrual irregularity. Dry skin, coarse hair, or brittle nails. Other symptoms may include: Muscle pain. Slowing of thought processes. Poor memory. How is this diagnosed? This condition may be diagnosed based on: Your symptoms, your medical history, and a physical exam. Blood tests. You may also have imaging tests, such as an ultrasound or MRI. How is this treated? This condition is treated with medicine that replaces the thyroid hormones that your body does not make. After you begin treatment, it may take several weeks for symptoms to go away. Follow these instructions at home: Take over-the-counter and prescription medicines only as told by your health care provider. If you start taking any new medicines, tell your health care provider. Keep all follow-up visits as told by your health care provider. This is important. As your condition improves, your dosage of thyroid hormone medicine may change. You will need to have blood tests regularly so that your health care provider can monitor your condition. Contact a health care provider if: Your symptoms do not get better with treatment. You are taking thyroid hormone replacement medicine and you: Sweat a lot. Have tremors. Feel anxious. Lose weight rapidly. Cannot tolerate heat. Have emotional swings. Have diarrhea. Feel weak. Get help right away if: You have chest pain. You have an irregular heartbeat. You have a rapid heartbeat. You have difficulty breathing. These symptoms may be an emergency. Get help right away. Call 911. Do not wait to see if the symptoms will go away. Do not drive yourself to the hospital. Summary Hypothyroidism is when  the thyroid gland does not make enough of certain hormones (it is underactive). When the thyroid is underactive, it produces too little of the hormones thyroxine (T4) and triiodothyronine (T3). The most common cause is Hashimoto's disease, a disease in which the body's disease-fighting system (immune system) attacks the thyroid gland. The condition can also be caused by viral infections, medicine, pregnancy, or past radiation treatment to the head or neck. Symptoms may include weight gain, dry skin, constipation, feeling as though you do not have energy, and not being able to tolerate cold. This condition is treated with medicine to replace the thyroid hormones that your body does not make. This information is not intended to replace advice given to you by your health care provider. Make sure you discuss any questions you have with your health care provider. Document Revised: 10/21/2021 Document Reviewed: 10/21/2021 Elsevier Patient Education  2023 [employer].

## 2022-05-06 NOTE — Progress Notes (Signed)
Acute Office Visit  Subjective:     Patient ID: Amanda Hardy, female    DOB: 08-Jun-1961, 61 y.o.   MRN: [id number]  Chief Complaint  Patient presents with   Fatigue   Med Check     HPI Patient is in today for Thyroid: Patient presents for evaluation of hypothyroidism. Current symptoms include fatigue. Patient denies constipation, feeling excessive energy, palpitations.    Fatigue  She reports chronic fatigue which she describes as a lack of energy and feeling weak. It began a few months ago and occurs all the time. It is described as marked and staying constant. She has not started new medications around the time the fatigue started.   Associated symptoms: No arthralgias No bleeding  No melena No chest discomfort  No heart palpitations No heart racing   No dyspnea No feeling depressed  No feeling anxious or under stress No fevers  No loss of appetite No nausea  No vomiting No sleeping problems    Wt Readings from Last 3 Encounters:  05/06/22 138 lb (62.6 kg)  03/23/22 138 lb (62.6 kg)  02/03/22 137 lb (62.1 kg)    Lab Results  Component Value Date   WBC 7.3 01/30/2022   HGB 13.3 01/30/2022   HCT 38.8 01/30/2022   MCV 96 01/30/2022   PLT 267 01/30/2022   Lab Results  Component Value Date   TSH 1.370 01/30/2022   Lab Results  Component Value Date   NA 138 01/30/2022   K 4.2 01/30/2022   CO2 24 01/30/2022   BUN 8 01/30/2022   CREATININE 0.65 01/30/2022   CALCIUM 9.9 01/30/2022   GLUCOSE 99 01/30/2022         Review of Systems  Constitutional:  Positive for malaise/fatigue.  HENT: Negative.    Respiratory: Negative.    Cardiovascular: Negative.   Genitourinary: Negative.   Skin: Negative.   Neurological: Negative.  Negative for dizziness and headaches.  All other systems reviewed and are negative.       Objective:    BP 113/60   Pulse 84   Temp 98.5 F (36.9 C)   Ht 5\' 4"  (1.626 m)   Wt 138 lb (62.6 kg)   LMP 08/01/2015    SpO2 96%   BMI 23.69 kg/m  BP Readings from Last 3 Encounters:  05/06/22 113/60  03/23/22 123/80  02/03/22 111/70   Wt Readings from Last 3 Encounters:  05/06/22 138 lb (62.6 kg)  03/23/22 138 lb (62.6 kg)  02/03/22 137 lb (62.1 kg)      Physical Exam Vitals and nursing note reviewed.  Constitutional:      Appearance: Normal appearance.  HENT:     Head: Normocephalic.     Right Ear: External ear normal.     Left Ear: External ear normal.     Nose: Nose normal.     Mouth/Throat:     Mouth: Mucous membranes are moist.     Pharynx: Oropharynx is clear.  Eyes:     Conjunctiva/sclera: Conjunctivae normal.  Cardiovascular:     Rate and Rhythm: Normal rate and regular rhythm.     Pulses: Normal pulses.     Heart sounds: Normal heart sounds.  Pulmonary:     Effort: Pulmonary effort is normal.     Breath sounds: Normal breath sounds.  Abdominal:     General: Bowel sounds are normal.  Skin:    General: Skin is warm.  Neurological:     General:  No focal deficit present.     Mental Status: She is alert and oriented to person, place, and time.  Psychiatric:        Mood and Affect: Mood normal.        Behavior: Behavior normal.     No results found for any visits on 05/06/22.      Assessment & Plan:   Problem List Items Addressed This Visit       Endocrine   Hypothyroidism    Patient continues to report worsening fatigue and weakness, symptoms may not necessarily be from Hypothyroid. Hypothyroid has been well controlled and patient has been taking her medication as scheduled.       Relevant Medications   levothyroxine (EUTHYROX) 75 MCG tablet   Other Relevant Orders   Thyroid Panel With TSH   Vitamin B12   Vitamin D, 25-hydroxy     Other   Other fatigue - Primary    Thi is not new for patient, completed Labs: B 12, Vit D, CBC, CMP. Completed referral to OB-GYN for reassessment of hormone levels and replacement if needed. Education provided to patient, printed  hand out given       Relevant Orders   Lyme Disease Serology w/Reflex    Meds ordered this encounter  Medications   levothyroxine (EUTHYROX) 75 MCG tablet    Sig: Take 1 tablet (75 mcg total) by mouth daily. (Needs to be seen before next refill)    Dispense:  30 tablet    Refill:  3    Return if symptoms worsen or fail to improve.  Daryll Drown, NP

## 2022-05-06 NOTE — Assessment & Plan Note (Signed)
Patient continues to report worsening fatigue and weakness, symptoms may not necessarily be from Hypothyroid. Hypothyroid has been well controlled and patient has been taking her medication as scheduled.

## 2022-05-06 NOTE — Assessment & Plan Note (Signed)
Thi is not new for patient, completed Labs: B 12, Vit D, CBC, CMP. Completed referral to OB-GYN for reassessment of hormone levels and replacement if needed. Education provided to patient, printed hand out given

## 2022-05-07 LAB — VITAMIN B12: Vitamin B-12: >2000 pg/mL — ABNORMAL HIGH (ref 232–1245)

## 2022-05-07 LAB — VITAMIN D 25 HYDROXY (VIT D DEFICIENCY, FRACTURES): Vit D, 25-Hydroxy: 42.2 ng/mL (ref 30.0–100.0)

## 2022-05-07 LAB — THYROID PANEL WITH TSH
Free Thyroxine Index: 2.4 (ref 1.2–4.9)
T3 Uptake Ratio: 29 % (ref 24–39)
T4, Total: 8.3 ug/dL (ref 4.5–12.0)
TSH: 1.74 u[IU]/mL (ref 0.450–4.500)

## 2022-05-07 LAB — LYME DISEASE SEROLOGY W/REFLEX: Lyme Total Antibody EIA: NEGATIVE

## 2022-05-11 NOTE — Progress Notes (Signed)
Patient calling back. Please call back.  

## 2022-05-29 ENCOUNTER — Telehealth: Payer: Self-pay | Admitting: Nurse Practitioner

## 2022-06-08 NOTE — Telephone Encounter (Signed)
Tried to call patient again, still unable to reach, encounter closed

## 2022-08-04 ENCOUNTER — Other Ambulatory Visit: Payer: Self-pay | Admitting: Nurse Practitioner

## 2022-08-04 DIAGNOSIS — Z1231 Encounter for screening mammogram for malignant neoplasm of breast: Secondary | ICD-10-CM

## 2022-08-10 ENCOUNTER — Ambulatory Visit
Admission: RE | Admit: 2022-08-10 | Discharge: 2022-08-10 | Disposition: A | Discharge status: Home / Self Care | Payer: 59 | Source: Ambulatory Visit | Attending: Nurse Practitioner | Admitting: Nurse Practitioner

## 2022-08-10 DIAGNOSIS — Z1231 Encounter for screening mammogram for malignant neoplasm of breast: Secondary | ICD-10-CM | POA: Diagnosis not present

## 2022-10-01 ENCOUNTER — Other Ambulatory Visit: Payer: Self-pay | Admitting: Nurse Practitioner

## 2022-10-01 DIAGNOSIS — E039 Hypothyroidism, unspecified: Secondary | ICD-10-CM

## 2022-12-31 ENCOUNTER — Encounter: Payer: Self-pay | Admitting: Family Medicine

## 2022-12-31 ENCOUNTER — Ambulatory Visit (INDEPENDENT_AMBULATORY_CARE_PROVIDER_SITE_OTHER): Payer: 59 | Admitting: Family Medicine

## 2022-12-31 VITALS — BP 119/73 | HR 95 | Temp 97.0°F | Ht 64.0 in | Wt 144.2 lb

## 2022-12-31 DIAGNOSIS — N6321 Unspecified lump in the left breast, upper outer quadrant: Secondary | ICD-10-CM

## 2022-12-31 NOTE — Progress Notes (Signed)
Subjective:  Patient ID: Amanda Hardy, female    DOB: 02-Sep-1961, 62 y.o.   MRN: [id number]  Patient Care Team: Ivy Lynn, NP (Inactive) as PCP - General (Nurse Practitioner)   Chief Complaint:  Breast Pain (Left x 2 1/2 weeks )   HPI: Amanda Hardy is a 62 y.o. female presenting on 12/31/2022 for Breast Pain (Left x 2 1/2 weeks )   Pt presents today with complaints of a mass to her left breast and axilla area. She has had some discomfort. No skin changes, nipple discharge, weight loss, night sweats, or fatigue. She noticed this 2.5 weeks ago. Denies excessive caffeine intake.       Relevant past medical, surgical, family, and social history reviewed and updated as indicated.  Allergies and medications reviewed and updated. Data reviewed: Chart in Epic.   Past Medical History:  Diagnosis Date   Cervical disc disease    History of multiple concussions    61 yo first time   Ruptured lumbar disc    Thyroid disease     Past Surgical History:  Procedure Laterality Date   DILATION AND CURETTAGE OF UTERUS      Social History   Socioeconomic History   Marital status: Married    Spouse name: Not on file   Number of children: Not on file   Years of education: Not on file   Highest education level: Not on file  Occupational History   Not on file  Tobacco Use   Smoking status: Former   Smokeless tobacco: Never  Vaping Use   Vaping Use: Never used  Substance and Sexual Activity   Alcohol use: No    Alcohol/week: 0.0 standard drinks of alcohol   Drug use: No   Sexual activity: Not on file  Other Topics Concern   Not on file  Social History Narrative   Lives with husband in a 2 story home.  Has 1 child.     Education: high school.  Housewife.     Social Determinants of Health   Financial Resource Strain: Not on file  Food Insecurity: Not on file  Transportation Needs: Not on file  Physical Activity: Not on file  Stress: Not on  file  Social Connections: Not on file  Intimate Partner Violence: Not on file    Outpatient Encounter Medications as of 12/31/2022  Medication Sig   BIOTIN PO Take by mouth.   Black Cohosh 160 MG CAPS Take by mouth.   cetirizine (ZYRTEC) 10 MG tablet Take 10 mg by mouth daily.   clobetasol ointment (TEMOVATE) [id number] % Apply 1 application topically 2 (two) times daily.   Docosahexaenoic Acid (DHA PO) Take by mouth.   levothyroxine (SYNTHROID) 75 MCG tablet TAKE 1 TABLET BY MOUTH ONCE DAILY . APPOINTMENT REQUIRED FOR FUTURE REFILLS   Omega 3-6-9 Fatty Acids (OMEGA DHA PO) Take 2,000 mg by mouth.   rosuvastatin (CRESTOR) 5 MG tablet Take 1 tablet (5 mg total) by mouth daily.   cyclobenzaprine (FLEXERIL) 5 MG tablet Take 1 tablet (5 mg total) by mouth 3 (three) times daily as needed for muscle spasms. (Patient not taking: Reported on 12/31/2022)   [DISCONTINUED] nitrofurantoin, macrocrystal-monohydrate, (MACROBID) 100 MG capsule Take 1 capsule (100 mg total) by mouth 2 (two) times daily. 1 po BId   [DISCONTINUED] phenazopyridine (PYRIDIUM) 100 MG tablet Take 1 tablet (100 mg total) by mouth 3 (three) times daily as needed for pain.   No facility-administered  encounter medications on file as of 12/31/2022.    Allergies  Allergen Reactions   Elemental Sulfur Rash    Family History, Prefers not to take   Sulfur Other (See Comments)    Family History, Prefers not to take    Review of Systems  Constitutional:  Negative for activity change, appetite change, chills, diaphoresis, fatigue, fever and unexpected weight change.  HENT: Negative.    Eyes: Negative.   Respiratory:  Negative for cough, chest tightness and shortness of breath.   Cardiovascular:  Negative for chest pain, palpitations and leg swelling.  Gastrointestinal:  Negative for abdominal pain, blood in stool, constipation, diarrhea, nausea and vomiting.  Endocrine: Negative.   Genitourinary:  Negative for dysuria, frequency and  urgency.       Breast mass and pain  Musculoskeletal:  Negative for arthralgias and myalgias.  Skin: Negative.   Allergic/Immunologic: Negative.   Neurological:  Negative for dizziness, tremors, seizures, syncope, facial asymmetry, speech difficulty, weakness, light-headedness, numbness and headaches.  Hematological: Negative.   Psychiatric/Behavioral:  Negative for confusion, hallucinations, sleep disturbance and suicidal ideas.   All other systems reviewed and are negative.       Objective:  BP 119/73   Pulse 95   Temp (!) 97 F (36.1 C) (Temporal)   Ht '5\' 4"'$  (1.626 m)   Wt 144 lb 3.2 oz (65.4 kg)   LMP 08/01/2015   SpO2 96%   BMI 24.75 kg/m    Wt Readings from Last 3 Encounters:  12/31/22 144 lb 3.2 oz (65.4 kg)  05/06/22 138 lb (62.6 kg)  03/23/22 138 lb (62.6 kg)    Physical Exam Vitals and nursing note reviewed.  Constitutional:      General: She is not in acute distress.    Appearance: Normal appearance. She is normal weight. She is not ill-appearing, toxic-appearing or diaphoretic.  HENT:     Head: Normocephalic and atraumatic.     Mouth/Throat:     Mouth: Mucous membranes are moist.  Eyes:     Pupils: Pupils are equal, round, and reactive to light.  Cardiovascular:     Rate and Rhythm: Normal rate.  Pulmonary:     Effort: Pulmonary effort is normal.  Chest:     Chest wall: No mass, lacerations, deformity, swelling, tenderness, crepitus or edema. There is no dullness to percussion.  Breasts:    Tanner Score is 5.     Breasts are symmetrical.     Right: Normal.     Left: Mass present. No swelling, bleeding, inverted nipple, nipple discharge, skin change or tenderness.    Lymphadenopathy:     Upper Body:     Right upper body: No supraclavicular, axillary or pectoral adenopathy.     Left upper body: Axillary adenopathy present. No supraclavicular or pectoral adenopathy.  Skin:    General: Skin is warm and dry.     Capillary Refill: Capillary refill  takes less than 2 seconds.  Neurological:     General: No focal deficit present.     Mental Status: She is alert and oriented to person, place, and time.  Psychiatric:        Mood and Affect: Mood normal.        Behavior: Behavior normal.        Thought Content: Thought content normal.        Judgment: Judgment normal.     Results for orders placed or performed in visit on 05/06/22  Thyroid Panel With TSH  Result  Value Ref Range   TSH 1.740 0.450 - 4.500 uIU/mL   T4, Total 8.3 4.5 - 12.0 ug/dL   T3 Uptake Ratio 29 24 - 39 %   Free Thyroxine Index 2.4 1.2 - 4.9  Vitamin B12  Result Value Ref Range   Vitamin B-12 >2000 (H) 232 - 1245 pg/mL  Vitamin D, 25-hydroxy  Result Value Ref Range   Vit D, 25-Hydroxy 42.2 30.0 - 100.0 ng/mL  Lyme Disease Serology w/Reflex  Result Value Ref Range   Lyme Total Antibody EIA Negative Negative       Pertinent labs & imaging results that were available during my care of the patient were reviewed by me and considered in my medical decision making.  Assessment & Plan:  Amanda Hardy was seen today for breast pain.  Diagnoses and all orders for this visit:  Mass of upper outer quadrant of left breast One mass felt cystic in nature, the other two were more concerning. Will obtain diagnostic imaging. Avoid excessive caffeine. Further treatment pending results.  -     MM Digital Diagnostic Unilat L; Future -     US BREAST COMPLETE UNI LEFT INC AXILLA     Continue all other maintenance medications.  Follow up plan: Return if symptoms worsen or fail to improve.   Continue healthy lifestyle choices, including diet (rich in fruits, vegetables, and lean proteins, and low in salt and simple carbohydrates) and exercise (at least 30 minutes of moderate physical activity daily).   The above assessment and management plan was discussed with the patient. The patient verbalized understanding of and has agreed to the management plan. Patient is aware to call  the clinic if they develop any new symptoms or if symptoms persist or worsen. Patient is aware when to return to the clinic for a follow-up visit. Patient educated on when it is appropriate to go to the emergency department.   Monia Pouch, FNP-C [facility] [phone]

## 2023-01-01 ENCOUNTER — Other Ambulatory Visit: Payer: Self-pay

## 2023-01-01 DIAGNOSIS — N6321 Unspecified lump in the left breast, upper outer quadrant: Secondary | ICD-10-CM

## 2023-01-14 ENCOUNTER — Telehealth: Payer: Self-pay | Admitting: Nurse Practitioner

## 2023-01-21 ENCOUNTER — Ambulatory Visit (HOSPITAL_COMMUNITY)
Admission: RE | Admit: 2023-01-21 | Discharge: 2023-01-21 | Disposition: A | Discharge status: Home / Self Care | Payer: 59 | Source: Ambulatory Visit | Attending: Family Medicine | Admitting: Family Medicine

## 2023-01-21 ENCOUNTER — Encounter (HOSPITAL_COMMUNITY): Payer: Self-pay

## 2023-01-21 DIAGNOSIS — N6321 Unspecified lump in the left breast, upper outer quadrant: Secondary | ICD-10-CM

## 2023-01-21 DIAGNOSIS — N644 Mastodynia: Secondary | ICD-10-CM | POA: Diagnosis not present

## 2023-01-22 NOTE — Telephone Encounter (Signed)
Patient has diagnostic and US done today - called patient to see if she needed anything else no answer generic vm lmtcb

## 2023-03-04 ENCOUNTER — Ambulatory Visit (INDEPENDENT_AMBULATORY_CARE_PROVIDER_SITE_OTHER): Payer: 59 | Admitting: Family Medicine

## 2023-03-04 ENCOUNTER — Ambulatory Visit: Payer: 59 | Admitting: Family Medicine

## 2023-03-04 ENCOUNTER — Encounter: Payer: Self-pay | Admitting: Family Medicine

## 2023-03-04 VITALS — BP 121/65 | HR 86 | Temp 98.3°F | Ht 64.0 in | Wt 141.1 lb

## 2023-03-04 DIAGNOSIS — R21 Rash and other nonspecific skin eruption: Secondary | ICD-10-CM | POA: Diagnosis not present

## 2023-03-04 MED ORDER — CLOBETASOL PROPIONATE 0.05 % EX OINT: 1.0000 | TOPICAL_OINTMENT | Freq: Two times a day (BID) | CUTANEOUS | 0 refills | Status: AC

## 2023-03-04 NOTE — Progress Notes (Signed)
   Acute Office Visit  Subjective:     Patient ID: Amanda Hardy, female    DOB: Nov 28, 1960, 62 y.o.   MRN: [id number]  Chief Complaint  Patient presents with   Rash    HPI Patient is in today for a rash on her right arm. This started the morning after she had her 2nd shingles shot at [place]. It started in one area in a line. This area as improved. She has new smaller spots pop up since. She reports that it burns. The burning worsens with touch or if clothing rubs against it. She denies exudate. It is not itchy. She denies any remedies.   ROS As per HPI.      Objective:    BP 121/65   Pulse 86   Temp 98.3 F (36.8 C) (Temporal)   Ht 5\' 4"  (1.626 m)   Wt 141 lb 2 oz (64 kg)   LMP 08/01/2015   BMI 24.22 kg/m    Physical Exam Vitals and nursing note reviewed.  Constitutional:      General: She is not in acute distress.    Appearance: Normal appearance. She is not ill-appearing, toxic-appearing or diaphoretic.  Cardiovascular:     Rate and Rhythm: Normal rate and regular rhythm.     Heart sounds: Normal heart sounds. No murmur heard. Pulmonary:     Effort: Pulmonary effort is normal. No respiratory distress.     Breath sounds: Normal breath sounds.  Skin:    General: Skin is warm and dry.     Findings: Rash present. Rash is vesicular (scattered along T1 dermatome on right arm).  Neurological:     General: No focal deficit present.     Mental Status: She is alert and oriented to person, place, and time.  Psychiatric:        Mood and Affect: Mood normal.        Behavior: Behavior normal.     No results found for any visits on 03/04/23.      Assessment & Plan:   Akylah was seen today for rash.  Diagnoses and all orders for this visit:  Rash Post shingrix. Mild. Discussed do not want to treat at this time with prednisone or antivirals as this would decrease effectiveness of shingrix vaccine. Can try clobetasol as below. Return to office for new or  worsening symptoms, or if symptoms persist.  -     clobetasol ointment (TEMOVATE) 0.05 %; Apply 1 Application topically 2 (two) times daily.  Return if symptoms worsen or fail to improve.  The patient indicates understanding of these issues and agrees with the plan.  Gabriel Earing, FNP

## 2023-03-08 ENCOUNTER — Encounter: Payer: Self-pay | Admitting: Family Medicine

## 2023-03-10 ENCOUNTER — Encounter: Payer: Self-pay | Admitting: Family Medicine

## 2023-03-10 ENCOUNTER — Ambulatory Visit (INDEPENDENT_AMBULATORY_CARE_PROVIDER_SITE_OTHER): Payer: 59 | Admitting: Family Medicine

## 2023-03-10 VITALS — BP 127/75 | HR 78 | Temp 98.2°F | Ht 64.0 in | Wt 141.0 lb

## 2023-03-10 DIAGNOSIS — E039 Hypothyroidism, unspecified: Secondary | ICD-10-CM | POA: Diagnosis not present

## 2023-03-10 DIAGNOSIS — G8929 Other chronic pain: Secondary | ICD-10-CM

## 2023-03-10 DIAGNOSIS — E785 Hyperlipidemia, unspecified: Secondary | ICD-10-CM

## 2023-03-10 DIAGNOSIS — R21 Rash and other nonspecific skin eruption: Secondary | ICD-10-CM

## 2023-03-10 DIAGNOSIS — M546 Pain in thoracic spine: Secondary | ICD-10-CM

## 2023-03-10 DIAGNOSIS — F5101 Primary insomnia: Secondary | ICD-10-CM

## 2023-03-10 MED ORDER — LEVOTHYROXINE SODIUM 75 MCG PO TABS: ORAL_TABLET | ORAL | 5 refills | Status: DC

## 2023-03-10 MED ORDER — ROSUVASTATIN CALCIUM 5 MG PO TABS: 5.0000 mg | ORAL_TABLET | Freq: Every day | ORAL | 0 refills | Status: DC

## 2023-03-10 NOTE — Progress Notes (Signed)
New Patient Office Visit  Subjective   Patient ID: Amanda Hardy, female    DOB: 1960/12/05  Age: 62 y.o. MRN: [id number]  CC:  Chief Complaint  Patient presents with   Medical Management of Chronic Issues    Med refill    HPI Amanda Hardy presents to follow up on chronic conditions   Acquired hypothyroidism Denies symptoms from her thyroid. Reports thar prevously she had hair loss, swelling, fatigue, and desire of salt.   Hyperlipidemia LDL goal <130 Denies side effects from crestor. States that she feels worse when she did not take it.   Primary insomnia Reports that she has insomnia from "being married to her husband" for the last 46 years.   Chronic bilateral thoracic back pain  Reports that she exercises everyday that sitting causes the pain to increase. Avoids lifting. States that she can feel it when riding. Had imaging in 2016. She states that her granddaughter is a Network engineer at [employer].   Rash  Started last week with Shingles shot. Was previously seen and provided clobetasol. Improving.   Outpatient Encounter Medications as of 03/10/2023  Medication Sig   BIOTIN PO Take by mouth.   Black Cohosh 160 MG CAPS Take by mouth.   clobetasol ointment (TEMOVATE) 0.05 % Apply 1 Application topically 2 (two) times daily.   cyclobenzaprine (FLEXERIL) 5 MG tablet Take 1 tablet (5 mg total) by mouth 3 (three) times daily as needed for muscle spasms.   Docosahexaenoic Acid (DHA PO) Take by mouth.   levothyroxine (SYNTHROID) 75 MCG tablet TAKE 1 TABLET BY MOUTH ONCE DAILY . APPOINTMENT REQUIRED FOR FUTURE REFILLS   Omega 3-6-9 Fatty Acids (OMEGA DHA PO) Take 2,000 mg by mouth.   rosuvastatin (CRESTOR) 5 MG tablet Take 1 tablet (5 mg total) by mouth daily.   No facility-administered encounter medications on file as of 03/10/2023.    Past Medical History:  Diagnosis Date   Cervical disc disease    History of multiple concussions    62 yo first  time   Ruptured lumbar disc    Thyroid disease     Past Surgical History:  Procedure Laterality Date   DILATION AND CURETTAGE OF UTERUS      Family History  Problem Relation Age of Onset   Hypertension Mother        Living, 80   Thyroid disease Mother    Dementia Father    Cancer Father        prostate   Thyroid disease Brother    Thyroid disease Sister    Healthy Daughter     Social History   Socioeconomic History   Marital status: Married    Spouse name: Not on file   Number of children: Not on file   Years of education: Not on file   Highest education level: Not on file  Occupational History   Not on file  Tobacco Use   Smoking status: Former   Smokeless tobacco: Never  Vaping Use   Vaping Use: Never used  Substance and Sexual Activity   Alcohol use: No    Alcohol/week: 0.0 standard drinks of alcohol   Drug use: No   Sexual activity: Not on file  Other Topics Concern   Not on file  Social History Narrative   Lives with husband in a 2 story home.  Has 1 child.     Education: high school.  Housewife.     Social Determinants of  Health   Financial Resource Strain: Not on file  Food Insecurity: Not on file  Transportation Needs: Not on file  Physical Activity: Not on file  Stress: Not on file  Social Connections: Not on file  Intimate Partner Violence: Not on file   ROS As per HPI   Objective   BP 127/75   Pulse 78   Temp 98.2 F (36.8 C)   Ht 5\' 4"  (1.626 m)   Wt 141 lb (64 kg)   LMP 08/01/2015   SpO2 96%   BMI 24.20 kg/m   Physical Exam Constitutional:      General: She is not in acute distress.    Appearance: Normal appearance. She is not ill-appearing, toxic-appearing or diaphoretic.  Neck:     Thyroid: No thyroid mass, thyromegaly or thyroid tenderness.  Cardiovascular:     Rate and Rhythm: Normal rate and regular rhythm.     Pulses: Normal pulses.     Heart sounds: Normal heart sounds. No murmur heard.    No friction rub. No  gallop.  Pulmonary:     Effort: Pulmonary effort is normal. No respiratory distress.     Breath sounds: Normal breath sounds. No stridor. No wheezing, rhonchi or rales.  Chest:     Chest wall: No tenderness.  Musculoskeletal:        General: Normal range of motion.     Cervical back: Neck supple.  Skin:    General: Skin is warm.     Capillary Refill: Capillary refill takes less than 2 seconds.     Coloration: Skin is not jaundiced or pale.     Findings: Rash present. No bruising or lesion. Rash is urticarial.  Neurological:     General: No focal deficit present.     Mental Status: She is alert and oriented to person, place, and time. Mental status is at baseline.     Cranial Nerves: No cranial nerve deficit.     Motor: No weakness.     Coordination: Coordination normal.  Psychiatric:        Mood and Affect: Mood normal.        Behavior: Behavior normal.        Thought Content: Thought content normal.        Judgment: Judgment normal.    Assessment & Plan:  1. Acquired hypothyroidism Refill as below. Labs as below. Will communicate results to patient once available.  Well controlled symptomatically.  - levothyroxine (SYNTHROID) 75 MCG tablet; TAKE 1 TABLET BY MOUTH ONCE DAILY . APPOINTMENT REQUIRED FOR FUTURE REFILLS  Dispense: 30 tablet; Refill: 5 - CBC with Differential/Platelet - CMP14+EGFR - Thyroid Panel With TSH - VITAMIN D 25 Hydroxy (Vit-D Deficiency, Fractures)  2. Hyperlipidemia LDL goal <130 Labs as below. Will communicate results to patient once available.  Fasting  - rosuvastatin (CRESTOR) 5 MG tablet; Take 1 tablet (5 mg total) by mouth daily.  Dispense: 90 tablet; Refill: 0 - Lipid panel  3. Primary insomnia Continues to have insomnia, discussed some options with patient. Will continue to assess need for treatment at follow up visit.   4. Chronic bilateral thoracic back pain Well controlled with conservative management. Declined referral to ortho/PT at  this time. Patient exercises regularly and states that she may work with her granddaughter who is a mobility specialist.   5. Rash Healing rash, currently taking clobetasol. Instructed patient to follow up if it does not improve.   The above assessment and management plan was discussed with  the patient. The patient verbalized understanding of and has agreed to the management plan using shared-decision making. Patient is aware to call the clinic if they develop any new symptoms or if symptoms fail to improve or worsen. Patient is aware when to return to the clinic for a follow-up visit. Patient educated on when it is appropriate to go to the emergency department.   Return in about 3 months (around 06/10/2023) for CPE.   Neale Burly, DNP-FNP [facility] [address] [place], [place] [address] [phone]

## 2023-03-11 LAB — LIPID PANEL
Chol/HDL Ratio: 2.5 ratio (ref 0.0–4.4)
Cholesterol, Total: 141 mg/dL (ref 100–199)
HDL: 56 mg/dL (ref >39)
LDL Chol Calc (NIH): 68 mg/dL (ref 0–99)
Triglycerides: 90 mg/dL (ref 0–149)
VLDL Cholesterol Cal: 17 mg/dL (ref 5–40)

## 2023-03-11 LAB — CBC WITH DIFFERENTIAL/PLATELET
Basophils Absolute: 0.1 10*3/uL (ref 0.0–0.2)
Basos: 1 %
EOS (ABSOLUTE): 0.1 10*3/uL (ref 0.0–0.4)
Eos: 2 %
Hematocrit: 39.4 % (ref 34.0–46.6)
Hemoglobin: 13.1 g/dL (ref 11.1–15.9)
Immature Grans (Abs): 0 10*3/uL (ref 0.0–0.1)
Immature Granulocytes: 0 %
Lymphocytes Absolute: 2.8 10*3/uL (ref 0.7–3.1)
Lymphs: 45 %
MCH: 32.6 pg (ref 26.6–33.0)
MCHC: 33.2 g/dL (ref 31.5–35.7)
MCV: 98 fL — ABNORMAL HIGH (ref 79–97)
Monocytes Absolute: 0.7 10*3/uL (ref 0.1–0.9)
Monocytes: 10 %
Neutrophils Absolute: 2.7 10*3/uL (ref 1.4–7.0)
Neutrophils: 42 %
Platelets: 266 10*3/uL (ref 150–450)
RBC: 4.02 x10E6/uL (ref 3.77–5.28)
RDW: 11.8 % (ref 11.7–15.4)
WBC: 6.4 10*3/uL (ref 3.4–10.8)

## 2023-03-11 LAB — THYROID PANEL WITH TSH
Free Thyroxine Index: 2.2 (ref 1.2–4.9)
T3 Uptake Ratio: 28 % (ref 24–39)
T4, Total: 8 ug/dL (ref 4.5–12.0)
TSH: 4.09 u[IU]/mL (ref 0.450–4.500)

## 2023-03-11 LAB — CMP14+EGFR
ALT: 14 IU/L (ref 0–32)
AST: 18 IU/L (ref 0–40)
Albumin/Globulin Ratio: 1.8 (ref 1.2–2.2)
Albumin: 4.6 g/dL (ref 3.9–4.9)
Alkaline Phosphatase: 53 IU/L (ref 44–121)
BUN/Creatinine Ratio: 14 (ref 12–28)
BUN: 11 mg/dL (ref 8–27)
Bilirubin Total: 0.3 mg/dL (ref 0.0–1.2)
CO2: 23 mmol/L (ref 20–29)
Calcium: 9.6 mg/dL (ref 8.7–10.3)
Chloride: 99 mmol/L (ref 96–106)
Creatinine, Ser: 0.76 mg/dL (ref 0.57–1.00)
Globulin, Total: 2.5 g/dL (ref 1.5–4.5)
Glucose: 99 mg/dL (ref 70–99)
Potassium: 4.2 mmol/L (ref 3.5–5.2)
Sodium: 138 mmol/L (ref 134–144)
Total Protein: 7.1 g/dL (ref 6.0–8.5)
eGFR: 88 mL/min/{1.73_m2} (ref >59)

## 2023-03-11 LAB — VITAMIN D 25 HYDROXY (VIT D DEFICIENCY, FRACTURES): Vit D, 25-Hydroxy: 40 ng/mL (ref 30.0–100.0)

## 2023-05-27 ENCOUNTER — Other Ambulatory Visit: Payer: Self-pay | Admitting: Family Medicine

## 2023-05-27 DIAGNOSIS — E785 Hyperlipidemia, unspecified: Secondary | ICD-10-CM

## 2023-06-14 IMAGING — DX DG ABDOMEN 1V
2 series · 2 of 2 positions shown · non-contrast
Comparison: None.

CLINICAL DATA: Abdominal pain

EXAM:
ABDOMEN - 1 VIEW

[abdomen kub (1 of 2)]
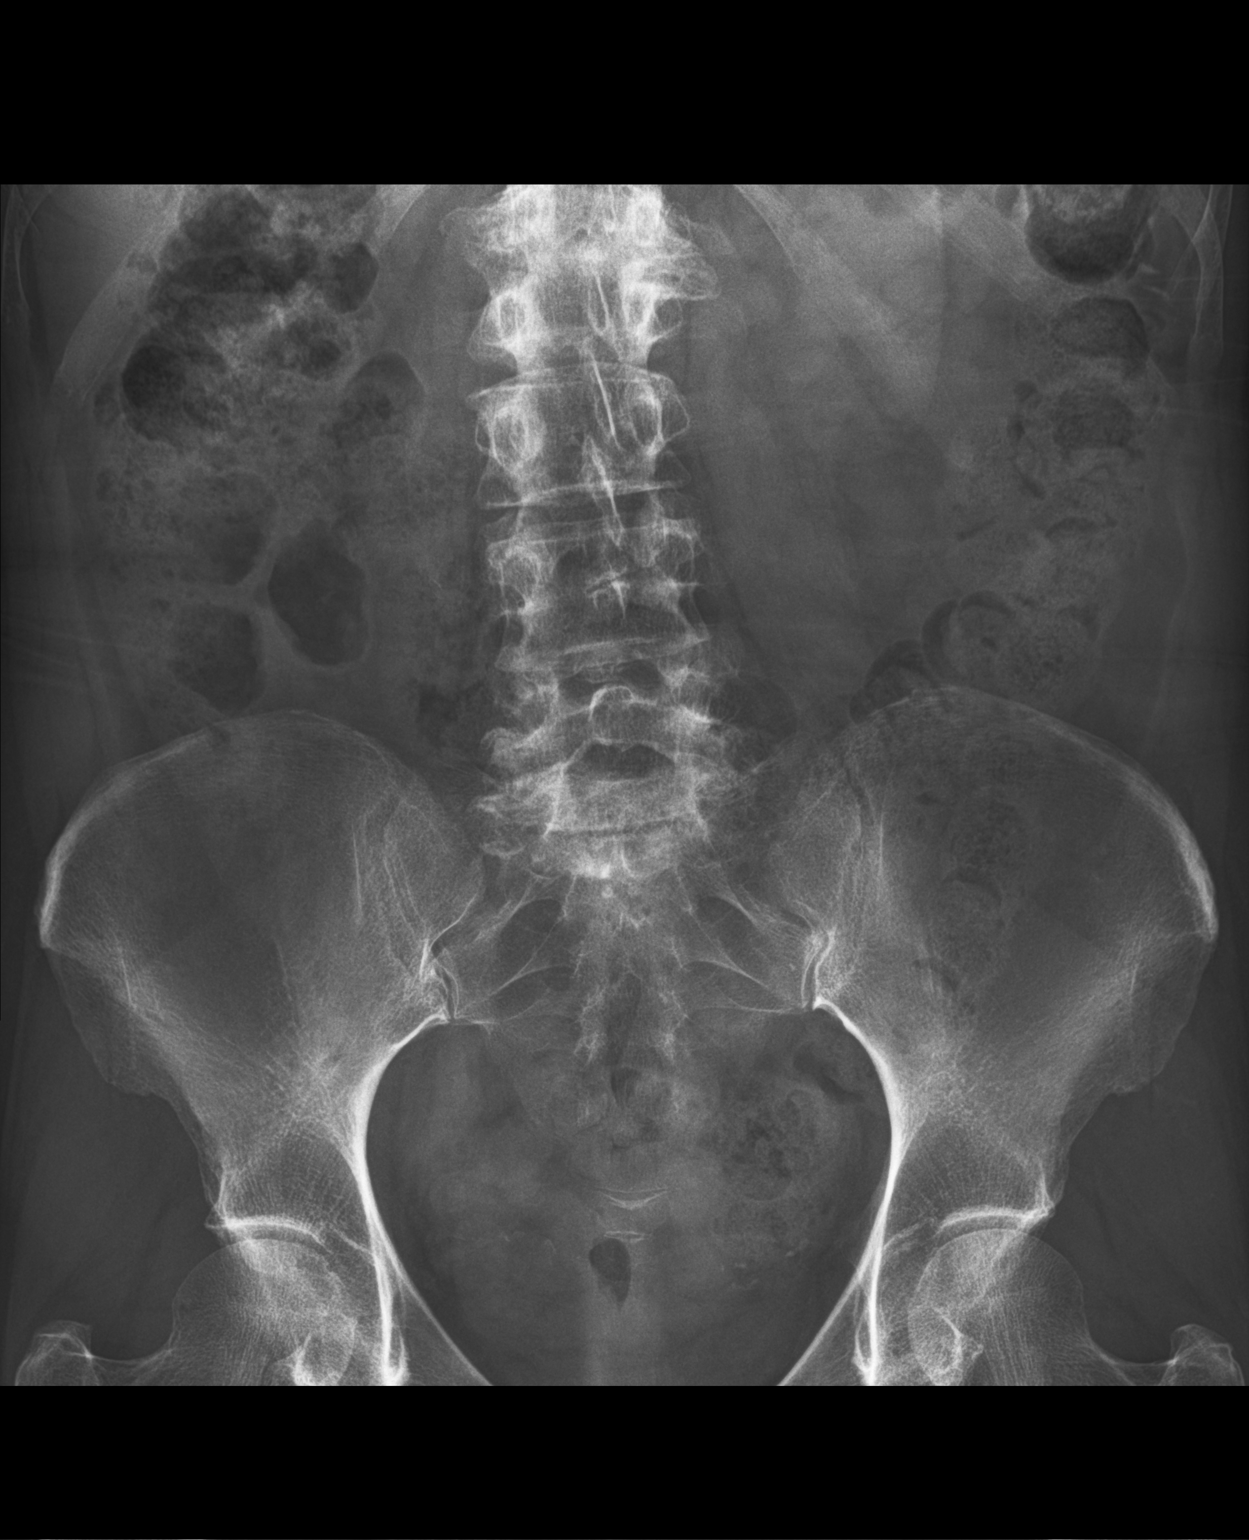

[abdomen kub (2 of 2)]
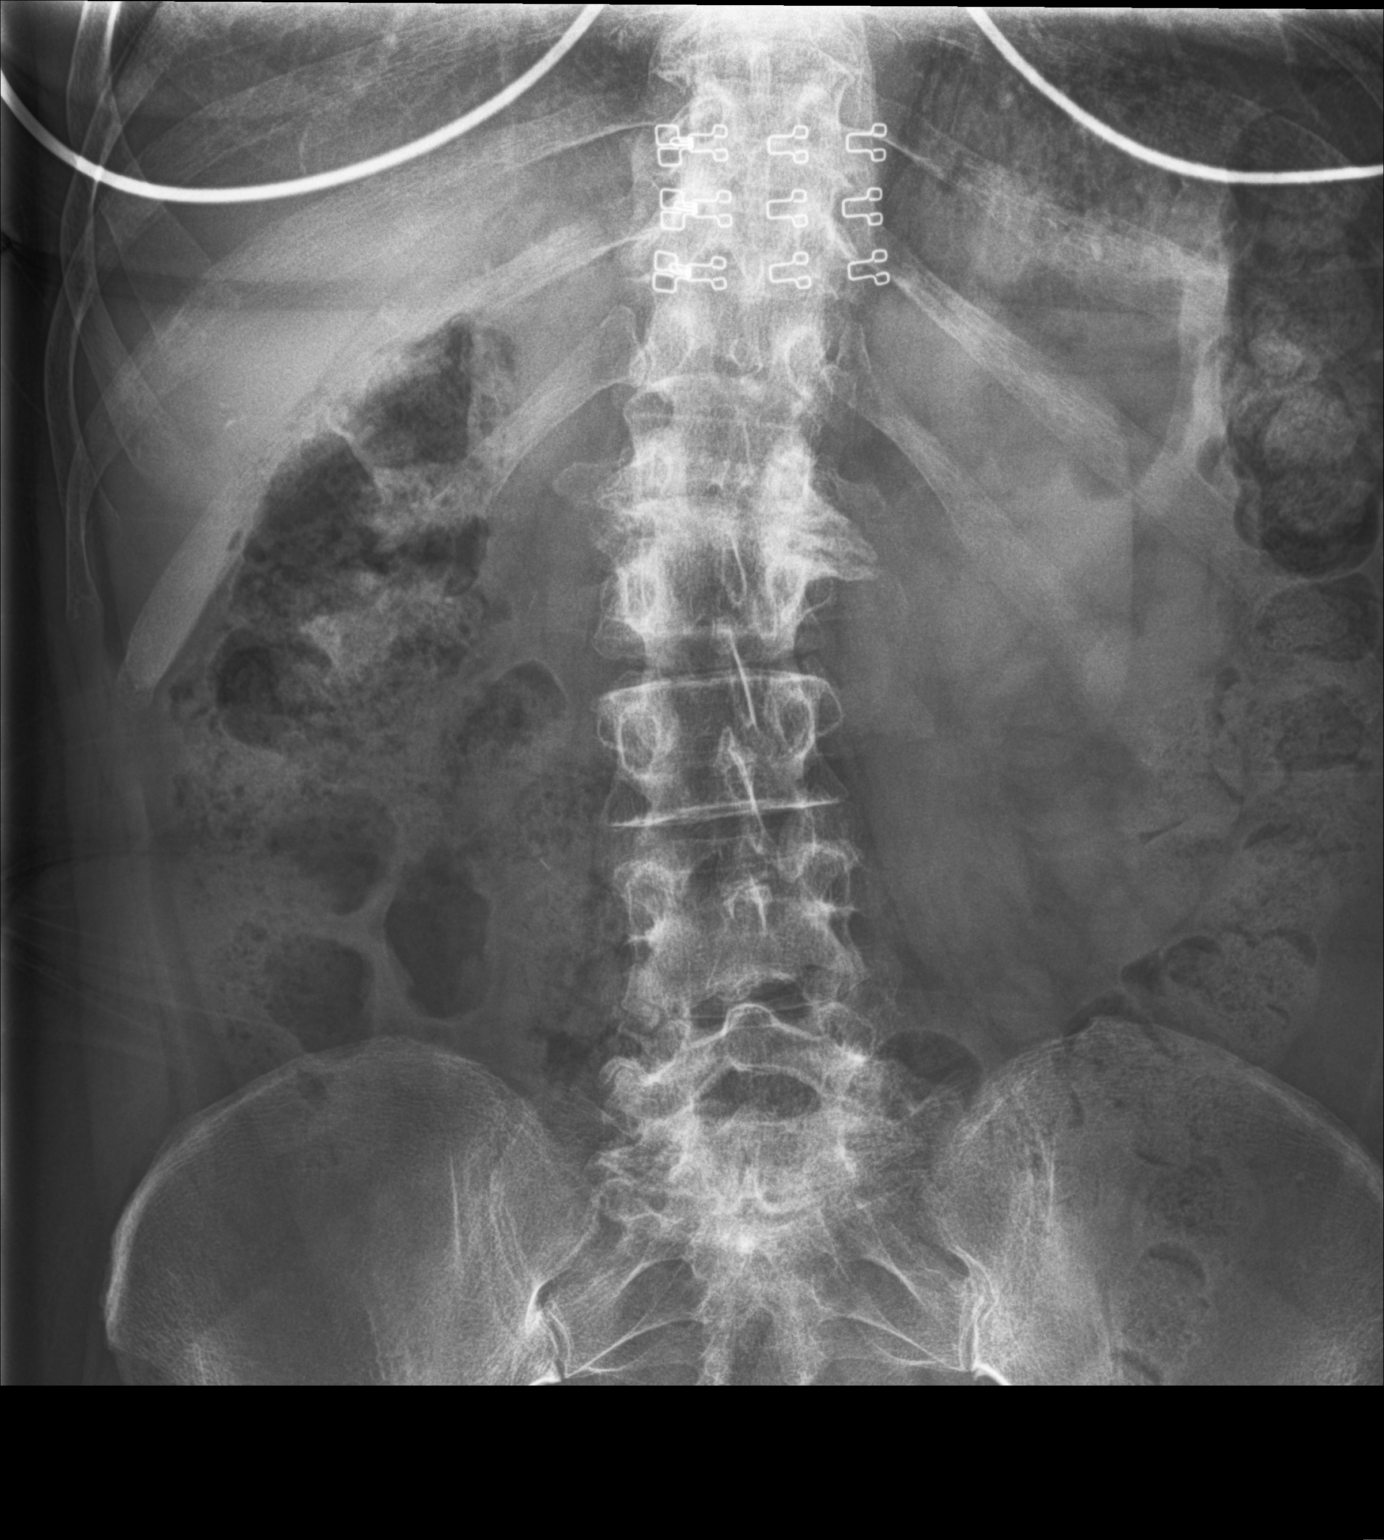

[2 of 2 positions shown; findings below may reference images not displayed]

FINDINGS: Bowel gas pattern is nonspecific. Moderate to large amount of stool
is seen in the colon. There is no fecal impaction in the
rectosigmoid. No abnormal masses or calcifications are seen. Kidneys
are partly obscured by bowel contents. Degenerative changes are
noted in the lumbar spine, particularly severe at L1-L2 level.
IMPRESSION: Nonspecific bowel gas pattern. Moderate to large stool burden in the
colon.

## 2023-07-14 ENCOUNTER — Ambulatory Visit (INDEPENDENT_AMBULATORY_CARE_PROVIDER_SITE_OTHER): Payer: 59 | Admitting: Family Medicine

## 2023-07-14 ENCOUNTER — Ambulatory Visit (INDEPENDENT_AMBULATORY_CARE_PROVIDER_SITE_OTHER): Payer: 59

## 2023-07-14 ENCOUNTER — Encounter: Payer: Self-pay | Admitting: Family Medicine

## 2023-07-14 VITALS — BP 125/83 | HR 86 | Temp 98.7°F | Ht 64.0 in | Wt 144.0 lb

## 2023-07-14 DIAGNOSIS — R0609 Other forms of dyspnea: Secondary | ICD-10-CM

## 2023-07-14 DIAGNOSIS — R0689 Other abnormalities of breathing: Secondary | ICD-10-CM | POA: Diagnosis not present

## 2023-07-14 DIAGNOSIS — R062 Wheezing: Secondary | ICD-10-CM | POA: Diagnosis not present

## 2023-07-14 MED ORDER — PREDNISONE 20 MG PO TABS
40.0000 mg | ORAL_TABLET | Freq: Every day | ORAL | 0 refills | Status: AC
Start: 2023-07-14 — End: 2023-07-19

## 2023-07-14 MED ORDER — ALBUTEROL SULFATE HFA 108 (90 BASE) MCG/ACT IN AERS
2.0000 | INHALATION_SPRAY | Freq: Four times a day (QID) | RESPIRATORY_TRACT | 2 refills | Status: DC | PRN
Start: 2023-07-14 — End: 2024-08-09

## 2023-07-14 NOTE — Progress Notes (Signed)
No acute abnormalities on Xray. Continue current plan. Follow up if symptoms continue and we can pursue pulmonary rehab.

## 2023-07-14 NOTE — Progress Notes (Signed)
Subjective:  Patient ID: Amanda Hardy, female    DOB: [birth year], 62 y.o.   MRN: [id number]  Patient Care Team: Arrie Senate, FNP as PCP - General (Family Medicine)   Chief Complaint:  Cough (Some shortness of breath/Covid + August 4th)  HPI: Amanda Hardy is a 62 y.o. female presenting on 07/14/2023 for Cough (Some shortness of breath/Covid + August 4th)  Cough  States that she had Covid August 4th. Symptoms were mild at that time. She started feeling better, then 2 weeks ago felt worse.  Reports that she feels there is not enough air coming in. She was trying to sing in choir and was not able to breathe through them. Also endorses dizziness and diaphoresis. Reports that she still has congestion.  Took OTC cough and flu medication for covid. Has not taken anything in the last 2 weeks. Endorses low grade fever of 99-100 degrees  Has strong family history of COPD.  Denies palpitations and chest pain.   Relevant past medical, surgical, family, and social history reviewed and updated as indicated.  Allergies and medications reviewed and updated. Data reviewed: Chart in Epic.  Past Medical History:  Diagnosis Date   Cervical disc disease    History of multiple concussions    62 yo first time   Ruptured lumbar disc    Thyroid disease    Past Surgical History:  Procedure Laterality Date   DILATION AND CURETTAGE OF UTERUS     Social History   Socioeconomic History   Marital status: Married    Spouse name: Not on file   Number of children: Not on file   Years of education: Not on file   Highest education level: Not on file  Occupational History   Not on file  Tobacco Use   Smoking status: Former   Smokeless tobacco: Never  Vaping Use   Vaping status: Never Used  Substance and Sexual Activity   Alcohol use: No    Alcohol/week: 0.0 standard drinks of alcohol   Drug use: No   Sexual activity: Not on file  Other Topics Concern   Not on  file  Social History Narrative   Lives with husband in a 2 story home.  Has 1 child.     Education: high school.  Housewife.     Social Determinants of Health   Financial Resource Strain: Not on file  Food Insecurity: Not on file  Transportation Needs: Not on file  Physical Activity: Not on file  Stress: Not on file  Social Connections: Unknown (03/14/2022)   Received from [facility]   Social Network    Social Network: Not on file  Intimate Partner Violence: Unknown (02/03/2022)   Received from [facility]   HITS    Physically Hurt: Not on file    Insult or Talk Down To: Not on file    Threaten Physical Harm: Not on file    Scream or Curse: Not on file    Outpatient Encounter Medications as of 07/14/2023  Medication Sig   BIOTIN PO Take by mouth.   Black Cohosh 160 MG CAPS Take by mouth.   clobetasol ointment (TEMOVATE) 0.05 % Apply 1 Application topically 2 (two) times daily.   cyclobenzaprine (FLEXERIL) 5 MG tablet Take 1 tablet (5 mg total) by mouth 3 (three) times daily as needed for muscle spasms.   Docosahexaenoic Acid (DHA PO) Take by mouth.   levothyroxine (SYNTHROID) 75  MCG tablet TAKE 1 TABLET BY MOUTH ONCE DAILY . APPOINTMENT REQUIRED FOR FUTURE REFILLS   Omega 3-6-9 Fatty Acids (OMEGA DHA PO) Take 2,000 mg by mouth.   rosuvastatin (CRESTOR) 5 MG tablet Take 1 tablet by mouth once daily   No facility-administered encounter medications on file as of 07/14/2023.   Allergies  Allergen Reactions   Elemental Sulfur Rash    Family History, Prefers not to take   Sulfur Other (See Comments)    Family History, Prefers not to take   Review of Systems  Respiratory:  Positive for cough.    As per HPI  Objective:  BP 125/83   Pulse 86   Temp 98.7 F (37.1 C)   Ht 5\' 4"  (1.626 m)   Wt 144 lb (65.3 kg)   LMP 08/01/2015   SpO2 97%   BMI 24.72 kg/m    Wt Readings from Last 3 Encounters:  07/14/23 144 lb (65.3 kg)  03/10/23 141 lb  (64 kg)  03/04/23 141 lb 2 oz (64 kg)   Physical Exam Constitutional:      General: She is awake. She is not in acute distress.    Appearance: Normal appearance. She is well-developed and well-groomed. She is not ill-appearing, toxic-appearing or diaphoretic.  Cardiovascular:     Rate and Rhythm: Normal rate and regular rhythm.     Pulses: Normal pulses.          Radial pulses are 2+ on the right side and 2+ on the left side.       Posterior tibial pulses are 2+ on the right side and 2+ on the left side.     Heart sounds: Normal heart sounds. No murmur heard.    No gallop.  Pulmonary:     Effort: Pulmonary effort is normal. No respiratory distress.     Breath sounds: No stridor. Examination of the right-lower field reveals wheezing. Wheezing present. No rhonchi or rales.  Musculoskeletal:     Cervical back: Full passive range of motion without pain and neck supple.     Right lower leg: No edema.     Left lower leg: No edema.  Skin:    General: Skin is warm.     Capillary Refill: Capillary refill takes less than 2 seconds.  Neurological:     General: No focal deficit present.     Mental Status: She is alert, oriented to person, place, and time and easily aroused. Mental status is at baseline.     GCS: GCS eye subscore is 4. GCS verbal subscore is 5. GCS motor subscore is 6.     Motor: No weakness.  Psychiatric:        Attention and Perception: Attention and perception normal.        Mood and Affect: Mood and affect normal.        Speech: Speech normal.        Behavior: Behavior normal. Behavior is cooperative.        Thought Content: Thought content normal. Thought content does not include homicidal or suicidal ideation. Thought content does not include homicidal or suicidal plan.        Cognition and Memory: Cognition and memory normal.        Judgment: Judgment normal.    Results for orders placed or performed in visit on 03/10/23  CBC with Differential/Platelet  Result  Value Ref Range   WBC 6.4 3.4 - 10.8 x10E3/uL   RBC 4.02 3.77 - 5.28 x10E6/uL  Hemoglobin 13.1 11.1 - 15.9 g/dL   Hematocrit 16.1 09.6 - 46.6 %   MCV 98 (H) 79 - 97 fL   MCH 32.6 26.6 - 33.0 pg   MCHC 33.2 31.5 - 35.7 g/dL   RDW 04.5 40.9 - 81.1 %   Platelets 266 150 - 450 x10E3/uL   Neutrophils 42 Not Estab. %   Lymphs 45 Not Estab. %   Monocytes 10 Not Estab. %   Eos 2 Not Estab. %   Basos 1 Not Estab. %   Neutrophils Absolute 2.7 1.4 - 7.0 x10E3/uL   Lymphocytes Absolute 2.8 0.7 - 3.1 x10E3/uL   Monocytes Absolute 0.7 0.1 - 0.9 x10E3/uL   EOS (ABSOLUTE) 0.1 0.0 - 0.4 x10E3/uL   Basophils Absolute 0.1 0.0 - 0.2 x10E3/uL   Immature Granulocytes 0 Not Estab. %   Immature Grans (Abs) 0.0 0.0 - 0.1 x10E3/uL  CMP14+EGFR  Result Value Ref Range   Glucose 99 70 - 99 mg/dL   BUN 11 8 - 27 mg/dL   Creatinine, Ser 9.14 0.57 - 1.00 mg/dL   eGFR 88 >78 GN/FAO/1.30   BUN/Creatinine Ratio 14 12 - 28   Sodium 138 134 - 144 mmol/L   Potassium 4.2 3.5 - 5.2 mmol/L   Chloride 99 96 - 106 mmol/L   CO2 23 20 - 29 mmol/L   Calcium 9.6 8.7 - 10.3 mg/dL   Total Protein 7.1 6.0 - 8.5 g/dL   Albumin 4.6 3.9 - 4.9 g/dL   Globulin, Total 2.5 1.5 - 4.5 g/dL   Albumin/Globulin Ratio 1.8 1.2 - 2.2   Bilirubin Total 0.3 0.0 - 1.2 mg/dL   Alkaline Phosphatase 53 44 - 121 IU/L   AST 18 0 - 40 IU/L   ALT 14 0 - 32 IU/L  Lipid panel  Result Value Ref Range   Cholesterol, Total 141 100 - 199 mg/dL   Triglycerides 90 0 - 149 mg/dL   HDL 56 >86 mg/dL   VLDL Cholesterol Cal 17 5 - 40 mg/dL   LDL Chol Calc (NIH) 68 0 - 99 mg/dL   Chol/HDL Ratio 2.5 0.0 - 4.4 ratio  Thyroid Panel With TSH  Result Value Ref Range   TSH 4.090 0.450 - 4.500 uIU/mL   T4, Total 8.0 4.5 - 12.0 ug/dL   T3 Uptake Ratio 28 24 - 39 %   Free Thyroxine Index 2.2 1.2 - 4.9  VITAMIN D 25 Hydroxy (Vit-D Deficiency, Fractures)  Result Value Ref Range   Vit D, 25-Hydroxy 40.0 30.0 - 100.0 ng/mL      07/14/2023    2:06 PM  03/10/2023   11:13 AM 03/04/2023   11:18 AM 12/31/2022    3:46 PM 05/06/2022   12:24 PM  Depression screen PHQ 2/9  Decreased Interest 0 0 0 0 0  Down, Depressed, Hopeless 0 0 0 0 0  PHQ - 2 Score 0 0 0 0 0  Altered sleeping 0 0 0    Tired, decreased energy 0 0 0    Change in appetite 0 0 0    Feeling bad or failure about yourself  0 0 0    Trouble concentrating 0 0 0    Moving slowly or fidgety/restless 0 0 0    Suicidal thoughts 0 0 0    PHQ-9 Score 0 0 0    Difficult doing work/chores Not difficult at all Not difficult at all Not difficult at all         07/14/2023  2:06 PM 03/10/2023   11:14 AM 03/04/2023   11:19 AM 01/30/2022   12:06 PM  GAD 7 : Generalized Anxiety Score  Nervous, Anxious, on Edge 0 0 0 0  Control/stop worrying 0 0 0 0  Worry too much - different things 0 0 0 0  Trouble relaxing 0 0 0 0  Restless 0 0 0 0  Easily annoyed or irritable 0 0 0 1  Afraid - awful might happen 0 0 0 0  Total GAD 7 Score 0 0 0 1  Anxiety Difficulty Not difficult at all Not difficult at all Not difficult at all Not difficult at all   Pertinent labs & imaging results that were available during my care of the patient were reviewed by me and considered in my medical decision making.  Assessment & Plan:  Zamorah was seen today for cough.  Diagnoses and all orders for this visit:  Other form of dyspnea Imaging as below. Will communicate results to patient once available. Will await results to determine next steps.  Will start medication as below to assist with symptoms.  -     DG Chest 2 View; Future -     albuterol (VENTOLIN HFA) 108 (90 Base) MCG/ACT inhaler; Inhale 2 puffs into the lungs every 6 (six) hours as needed for wheezing or shortness of breath. -     predniSONE (DELTASONE) 20 MG tablet; Take 2 tablets (40 mg total) by mouth daily with breakfast for 5 days.  Wheeze Imaging as below. Will communicate results to patient once available. Will await results to determine next  steps.  Will start medication as below to assist with symptoms.  -     DG Chest 2 View; Future -     albuterol (VENTOLIN HFA) 108 (90 Base) MCG/ACT inhaler; Inhale 2 puffs into the lungs every 6 (six) hours as needed for wheezing or shortness of breath. -     predniSONE (DELTASONE) 20 MG tablet; Take 2 tablets (40 mg total) by mouth daily with breakfast for 5 days.   Continue all other maintenance medications.  Follow up plan: Return in about 3 months (around 10/13/2023), or if symptoms worsen or fail to improve, for Chronic Condition Follow up.  Continue healthy lifestyle choices, including diet (rich in fruits, vegetables, and lean proteins, and low in salt and simple carbohydrates) and exercise (at least 30 minutes of moderate physical activity daily).  Written and verbal instructions provided   The above assessment and management plan was discussed with the patient. The patient verbalized understanding of and has agreed to the management plan. Patient is aware to call the clinic if they develop any new symptoms or if symptoms persist or worsen. Patient is aware when to return to the clinic for a follow-up visit. Patient educated on when it is appropriate to go to the emergency department.   Neale Burly, DNP-FNP [facility] [address] [place], [place] [address] [phone]

## 2023-08-06 ENCOUNTER — Other Ambulatory Visit: Payer: Self-pay | Admitting: Family Medicine

## 2023-08-06 DIAGNOSIS — Z1212 Encounter for screening for malignant neoplasm of rectum: Secondary | ICD-10-CM

## 2023-08-06 DIAGNOSIS — Z1211 Encounter for screening for malignant neoplasm of colon: Secondary | ICD-10-CM

## 2023-08-12 IMAGING — US US RENAL
1 series · 14 of 25 positions shown · non-contrast
Comparison: None Available.

CLINICAL DATA: ongoing flank pain

EXAM:
RENAL / URINARY TRACT ULTRASOUND COMPLETE

[Series 1: us renal · 14 of 81 slices shown]
[im 1/81]
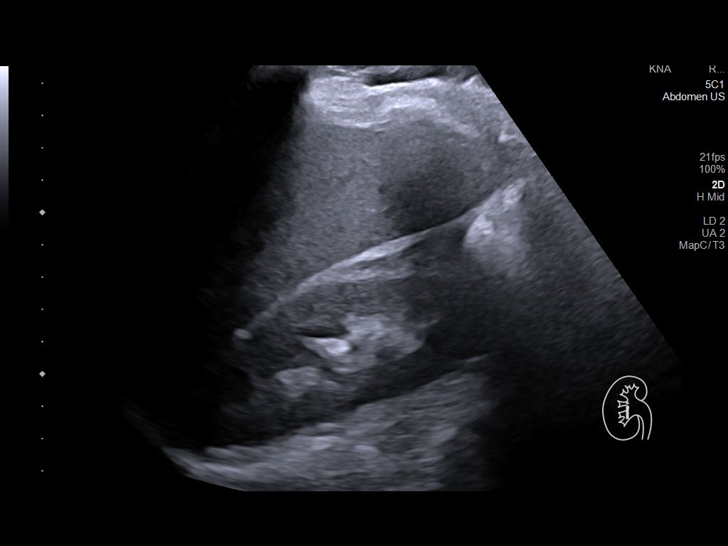
[im 7/81]
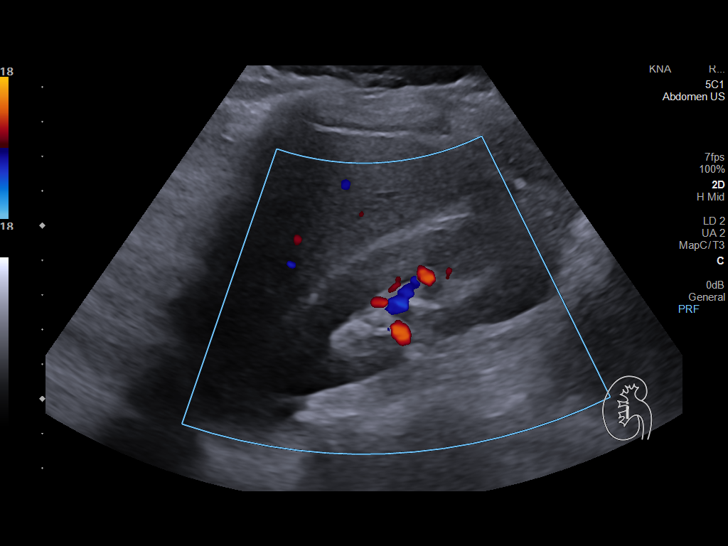
[im 14/81]
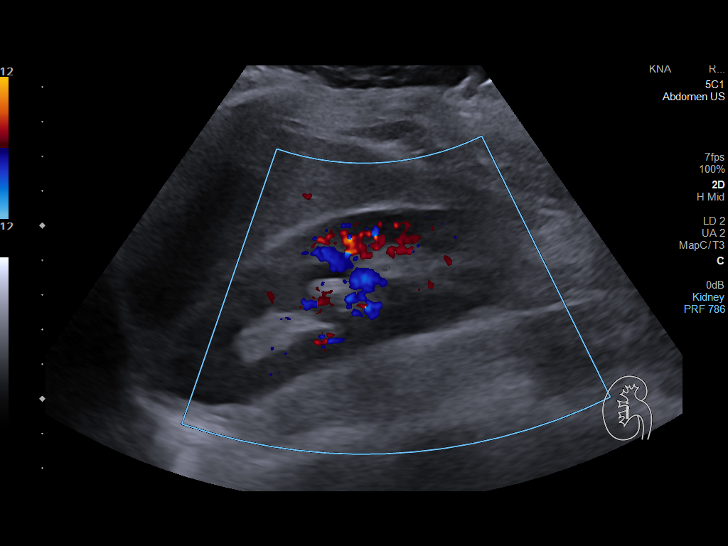
[im 21/81]
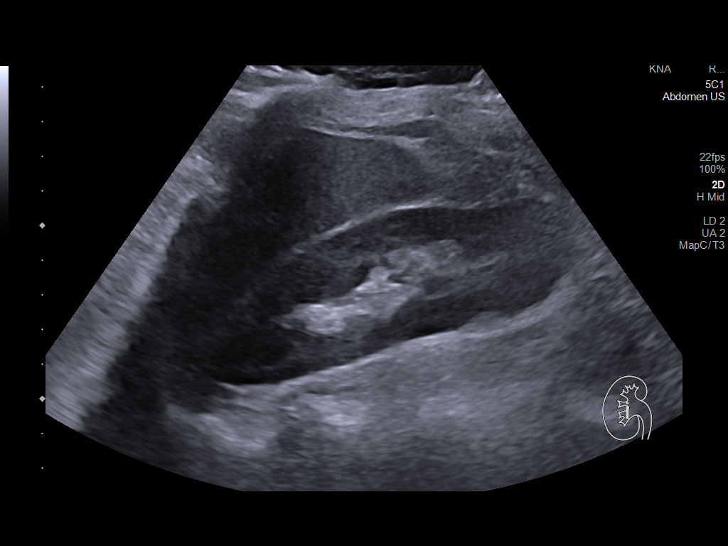
[im 27/81]
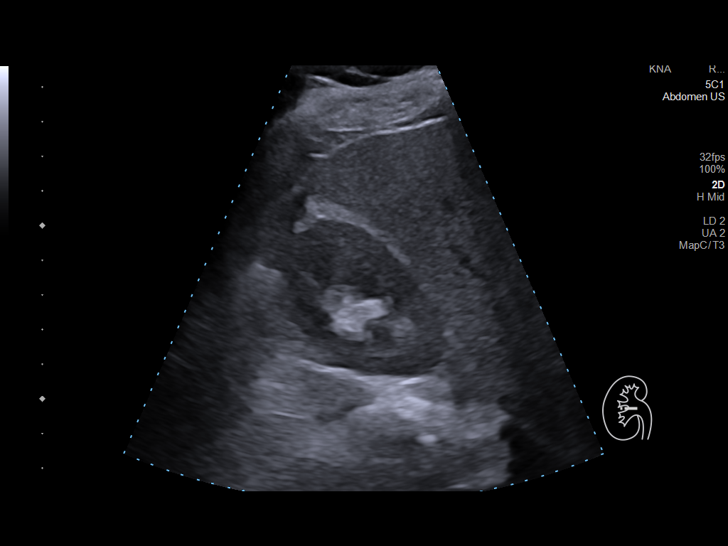
[im 31/81]
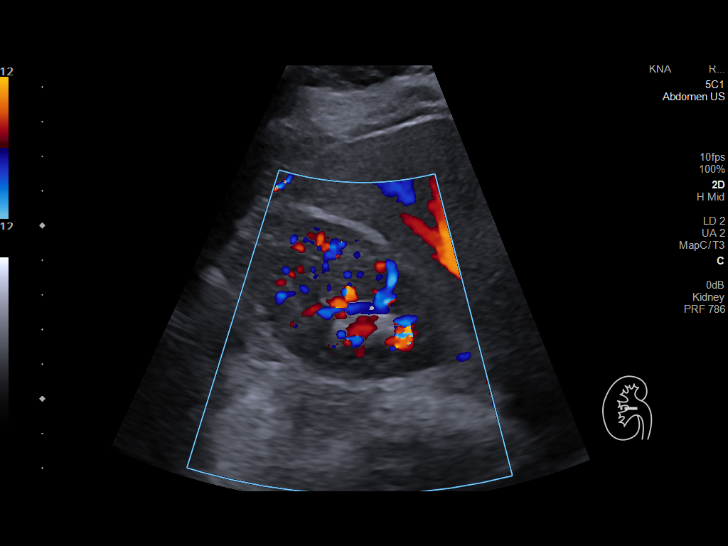
[im 37/81]
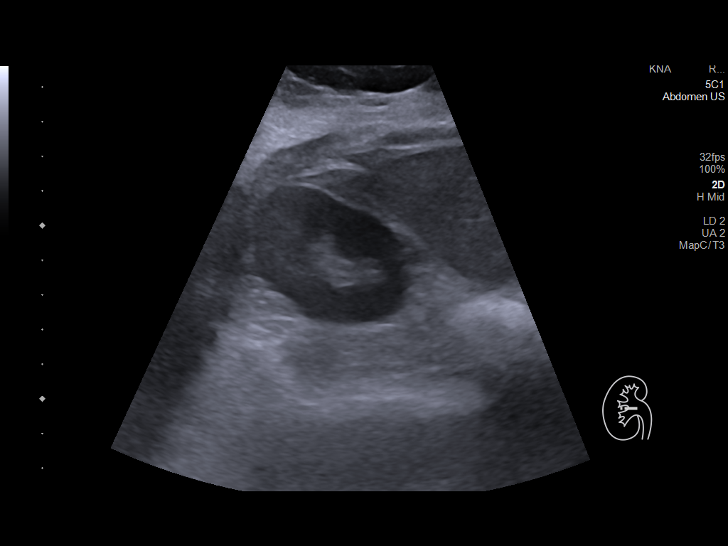
[im 44/81]
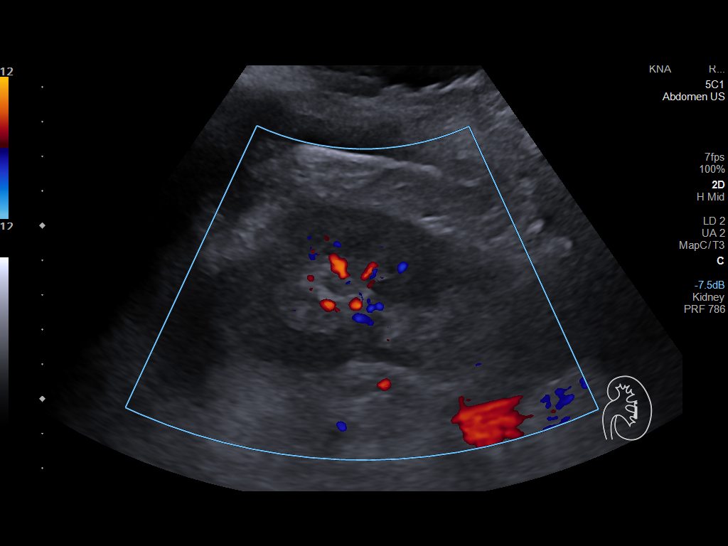
[im 51/81]
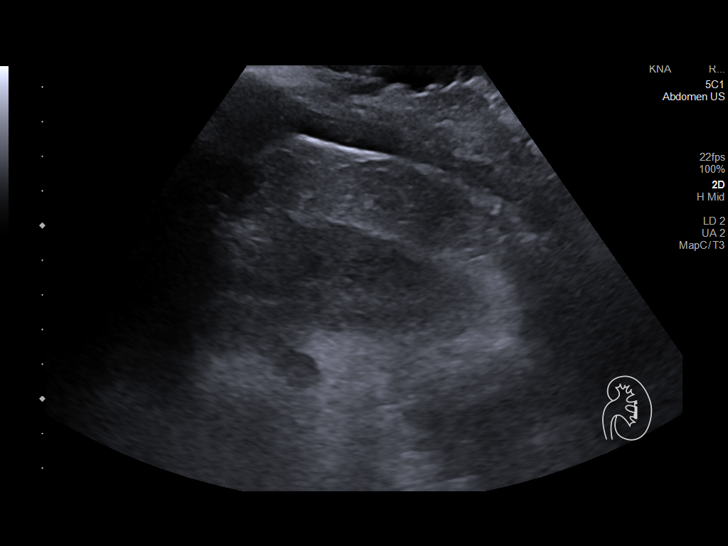
[im 54/81]
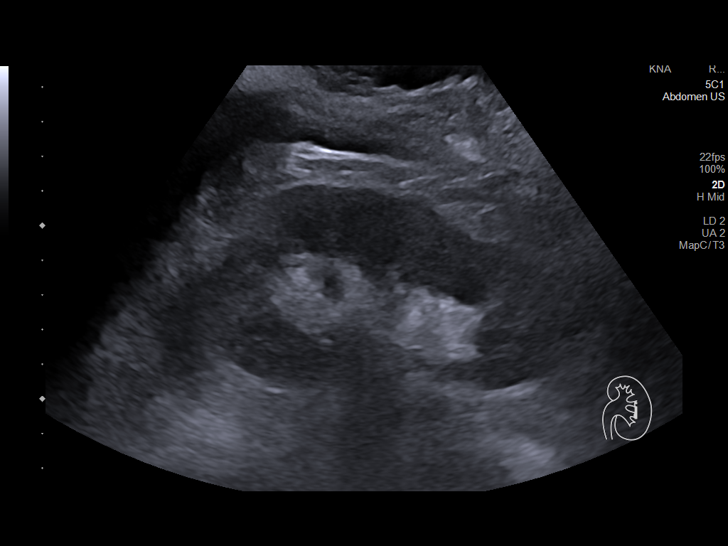
[im 61/81]
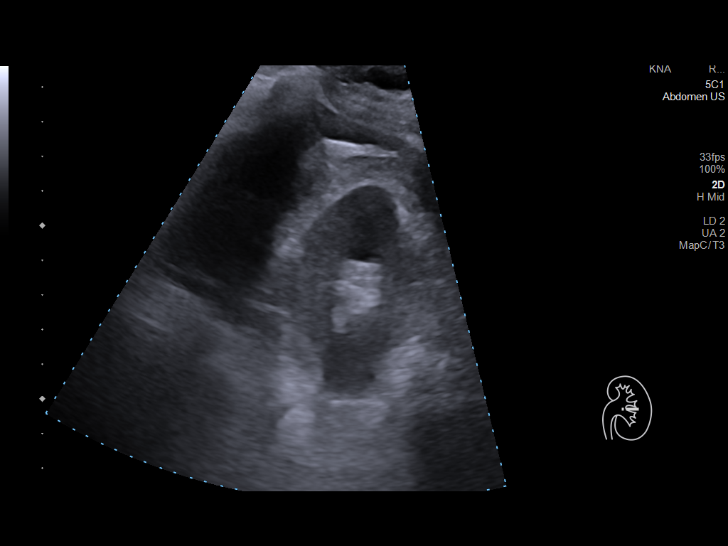
[im 67/81]
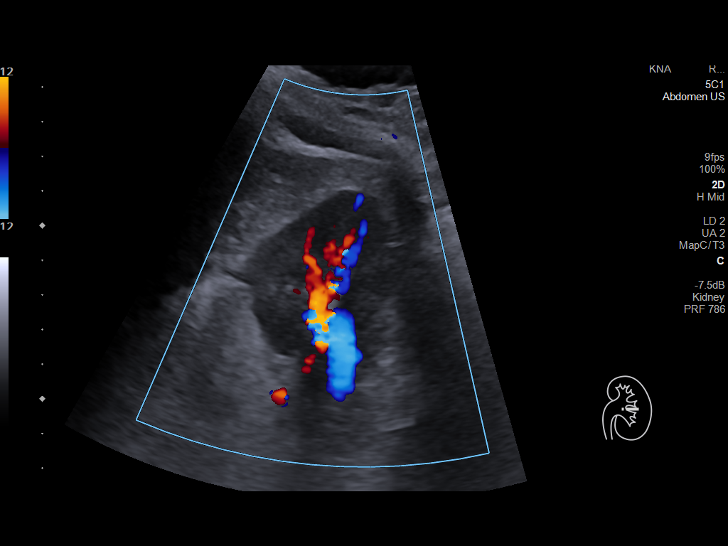
[im 74/81]
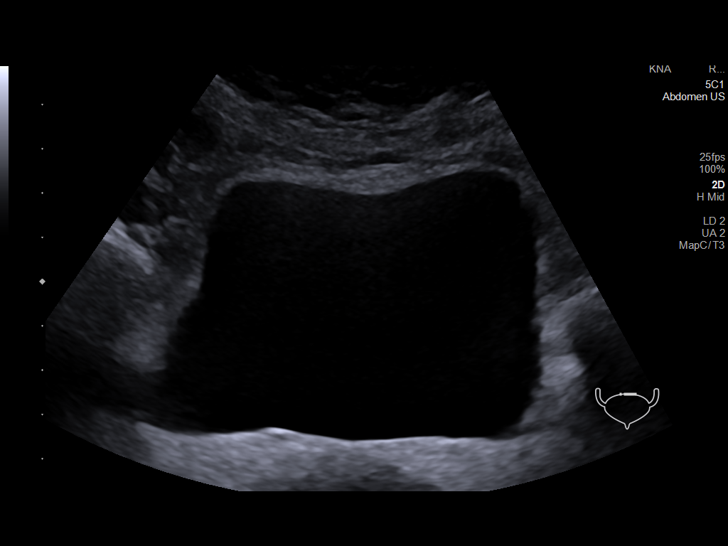
[im 81/81]
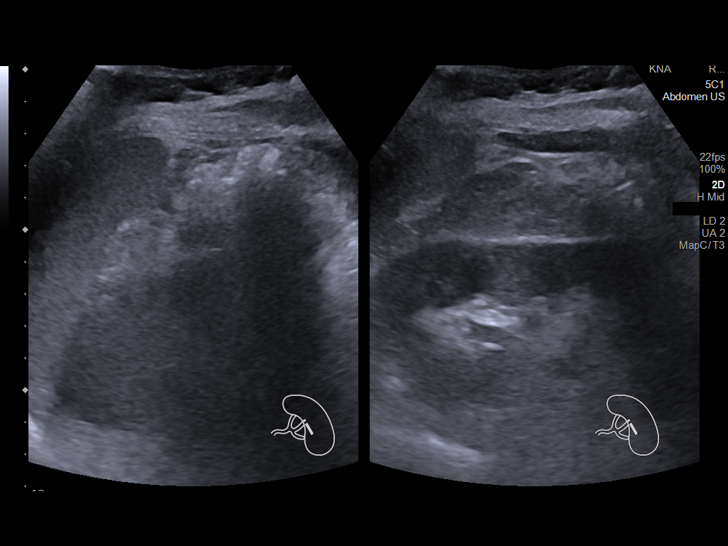

[14 of 25 positions shown; findings below may reference images not displayed]

FINDINGS: Right Kidney:

Renal measurements: 10.9 x 3.8 x 4.6 cm = volume: 99 mL.
Echogenicity within normal limits. No mass or hydronephrosis
visualized.

Left Kidney:

Renal measurements: 11 x 6.3 x 4.9 cm = volume: 175 mL. Echogenicity
within normal limits. No mass or hydronephrosis visualized.

Urinary bladder:

Appears normal for degree of bladder distention.

Other:

None.
IMPRESSION: Unremarkable renal ultrasound.

## 2023-08-17 ENCOUNTER — Other Ambulatory Visit: Payer: Self-pay | Admitting: Family Medicine

## 2023-08-17 DIAGNOSIS — E785 Hyperlipidemia, unspecified: Secondary | ICD-10-CM

## 2023-08-17 NOTE — Telephone Encounter (Signed)
Hillis Range in Nov for 6 mos FU RF sent to pharmacy

## 2023-08-17 NOTE — Telephone Encounter (Signed)
I called pt & made her appt on Dec 12 at 9:50am w/GM for RX refill.

## 2023-08-19 ENCOUNTER — Encounter: Payer: Self-pay | Admitting: Family Medicine

## 2023-08-19 ENCOUNTER — Ambulatory Visit (INDEPENDENT_AMBULATORY_CARE_PROVIDER_SITE_OTHER): Payer: 59 | Admitting: Family Medicine

## 2023-08-19 VITALS — BP 127/68 | HR 88 | Temp 97.9°F | Ht 64.0 in | Wt 146.1 lb

## 2023-08-19 DIAGNOSIS — Z808 Family history of malignant neoplasm of other organs or systems: Secondary | ICD-10-CM

## 2023-08-19 DIAGNOSIS — L989 Disorder of the skin and subcutaneous tissue, unspecified: Secondary | ICD-10-CM

## 2023-08-19 NOTE — Progress Notes (Signed)
   Acute Office Visit  Subjective:     Patient ID: Amanda Hardy, female    DOB: [birth year], 62 y.o.   MRN: [id number]  Chief Complaint  Patient presents with   Skin Tag    HPI Patient is in today for skin lesion. She has had some spots under both eyes for years. These have been flesh colored at flat, however 1 spot under her right eye has gotten larger and raised over the last few months. Denies itching, tenderness, drainage. She has had a lot of sun exposure to her face over the years. Her mother had skin cancer on her face and this concerns her.   ROS As per HPI.     Objective:    BP 127/68   Pulse 88   Temp 97.9 F (36.6 C) (Temporal)   Ht 5\' 4"  (1.626 m)   Wt 146 lb 2 oz (66.3 kg)   LMP 08/01/2015   SpO2 95%   BMI 25.08 kg/m    Physical Exam Vitals and nursing note reviewed.  Constitutional:      General: She is not in acute distress.    Appearance: She is not ill-appearing, toxic-appearing or diaphoretic.  Pulmonary:     Effort: Pulmonary effort is normal. No respiratory distress.  Skin:    Findings: Lesion (2 mm raised flesh colored lesion on right cheek- see picture below.) present.  Neurological:     General: No focal deficit present.     Mental Status: She is alert and oriented to person, place, and time.  Psychiatric:        Mood and Affect: Mood normal.        Behavior: Behavior normal.     No results found for any visits on 08/19/23.      Assessment & Plan:   Amanda Hardy was seen today for skin tag.  Diagnoses and all orders for this visit:  Facial skin lesion Family history of skin cancer Discussed likely benign, however given hx of sun exposure and family hx of skin cancer, will refer to derm for further evaluation.  -     Ambulatory referral to Dermatology   Follow up as needed.   The patient indicates understanding of these issues and agrees with the plan.  Gabriel Earing, FNP

## 2023-08-31 ENCOUNTER — Telehealth: Payer: Self-pay | Admitting: Family Medicine

## 2023-09-01 NOTE — Telephone Encounter (Signed)
Pt states that she has not received a call from dermatology. Made her aware that it is on their end to call her and schedule. Phone number given to pt to call Dr. Scharlene Gloss office. Pt states that she would like to wait for an appt to see them instead of following up here.

## 2023-09-03 ENCOUNTER — Encounter: Payer: Self-pay | Admitting: *Deleted

## 2023-09-28 ENCOUNTER — Other Ambulatory Visit: Payer: Self-pay | Admitting: Family Medicine

## 2023-09-28 DIAGNOSIS — E039 Hypothyroidism, unspecified: Secondary | ICD-10-CM

## 2023-10-14 ENCOUNTER — Encounter: Payer: Self-pay | Admitting: Family Medicine

## 2023-10-14 ENCOUNTER — Ambulatory Visit: Payer: Medicaid Other | Admitting: Family Medicine

## 2023-10-14 VITALS — BP 127/71 | HR 76 | Temp 98.2°F | Ht 64.0 in | Wt 145.0 lb

## 2023-10-14 DIAGNOSIS — E039 Hypothyroidism, unspecified: Secondary | ICD-10-CM | POA: Diagnosis not present

## 2023-10-14 DIAGNOSIS — M546 Pain in thoracic spine: Secondary | ICD-10-CM | POA: Diagnosis not present

## 2023-10-14 DIAGNOSIS — F5101 Primary insomnia: Secondary | ICD-10-CM

## 2023-10-14 DIAGNOSIS — E785 Hyperlipidemia, unspecified: Secondary | ICD-10-CM

## 2023-10-14 DIAGNOSIS — G8929 Other chronic pain: Secondary | ICD-10-CM

## 2023-10-14 MED ORDER — LEVOTHYROXINE SODIUM 75 MCG PO TABS
ORAL_TABLET | ORAL | 1 refills | Status: DC
Start: 2023-10-14 — End: 2024-03-20

## 2023-10-14 MED ORDER — ROSUVASTATIN CALCIUM 5 MG PO TABS: 5.0000 mg | ORAL_TABLET | Freq: Every day | ORAL | 0 refills | Status: DC

## 2023-10-14 NOTE — Progress Notes (Signed)
Subjective:  Patient ID: Amanda Hardy, female    DOB: Aug 02, 1961, 62 y.o.   MRN: [id number]  Patient Care Team: Arrie Senate, FNP as PCP - General (Family Medicine)   Chief Complaint:  Medical Management of Chronic Issues (Fasting labs)   HPI: Amanda Hardy is a 62 y.o. female presenting on 10/14/2023 for Medical Management of Chronic Issues (Fasting labs)  HPI 1. Acquired hypothyroidism Thyroid: Patient presents for evaluation of hypothyroidism. Current symptoms include feeling cold and cold intolerance, anxiousness, feeling excessive energy. Patient denies constipation, swelling, tremulousness, palpitations, weight loss, diarrhea, change in skin,  nails, or hair, heat intolerance, depression, goiter, ocular symptoms.   2. Hyperlipidemia LDL goal <130 Lipid/Cholesterol, Follow-up  Last lipid panel Other pertinent labs  Lab Results  Component Value Date   CHOL 141 03/10/2023   HDL 56 03/10/2023   LDLCALC 68 03/10/2023   TRIG 90 03/10/2023   CHOLHDL 2.5 03/10/2023   Lab Results  Component Value Date   ALT 14 03/10/2023   AST 18 03/10/2023   PLT 266 03/10/2023   TSH 4.090 03/10/2023     She was last seen for this 3 months ago.  Management since that visit includes crestor 5mg .  She reports excellent compliance with treatment. She is having side effects. Acid reflux   Symptoms: No chest pain No chest pressure/discomfort  No dyspnea No lower extremity edema  No numbness or tingling of extremity No orthopnea  No palpitations No paroxysmal nocturnal dyspnea  No speech difficulty No syncope   Current diet: well balanced Current exercise: walking  The 10-year ASCVD risk score (Arnett DK, et al., 2019) is: 3.5%  ---------------------------------------------------------------------------------------------------  3. Chronic bilateral thoracic back pain States that this is going okay. States that it is exacerbated with lifting and  standing too long. She manages the wood in the stove, so she is lifting wood daily. Takes flexeril as needed.   4. Primary insomnia States that she has not slept in the last 46 years. States that she gets up in the middle of the night to put wood in the stove. States that she would be willing to try something for sleep in the summer when she does not need a fire at night.   Relevant past medical, surgical, family, and social history reviewed and updated as indicated.  Allergies and medications reviewed and updated. Data reviewed: Chart in Epic.  Past Medical History:  Diagnosis Date   Cervical disc disease    History of multiple concussions    62 yo first time   Ruptured lumbar disc    Thyroid disease    Past Surgical History:  Procedure Laterality Date   DILATION AND CURETTAGE OF UTERUS      Social History   Socioeconomic History   Marital status: Married    Spouse name: Not on file   Number of children: Not on file   Years of education: Not on file   Highest education level: Not on file  Occupational History   Not on file  Tobacco Use   Smoking status: Former   Smokeless tobacco: Never  Vaping Use   Vaping status: Never Used  Substance and Sexual Activity   Alcohol use: No    Alcohol/week: 0.0 standard drinks of alcohol   Drug use: No   Sexual activity: Not on file  Other Topics Concern   Not on file  Social History Narrative   Lives with husband in a 2 story  home.  Has 1 child.     Education: high school.  Housewife.     Social Drivers of Corporate investment banker Strain: Not on file  Food Insecurity: Not on file  Transportation Needs: Not on file  Physical Activity: Not on file  Stress: Not on file  Social Connections: Unknown (03/14/2022)   Received from [facility]   Social Network    Social Network: Not on file  Intimate Partner Violence: Unknown (02/03/2022)   Received from [facility]   HITS    Physically Hurt: Not  on file    Insult or Talk Down To: Not on file    Threaten Physical Harm: Not on file    Scream or Curse: Not on file    Outpatient Encounter Medications as of 10/14/2023  Medication Sig   albuterol (VENTOLIN HFA) 108 (90 Base) MCG/ACT inhaler Inhale 2 puffs into the lungs every 6 (six) hours as needed for wheezing or shortness of breath.   BIOTIN PO Take by mouth.   clobetasol ointment (TEMOVATE) 0.05 % Apply 1 Application topically 2 (two) times daily.   cyclobenzaprine (FLEXERIL) 5 MG tablet Take 1 tablet (5 mg total) by mouth 3 (three) times daily as needed for muscle spasms.   Docosahexaenoic Acid (DHA PO) Take by mouth.   levothyroxine (SYNTHROID) 75 MCG tablet TAKE 1 TABLET BY MOUTH ONCE DAILY . APPOINTMENT REQUIRED FOR FUTURE REFILLS   Omega 3-6-9 Fatty Acids (OMEGA DHA PO) Take 2,000 mg by mouth.   rosuvastatin (CRESTOR) 5 MG tablet Take 1 tablet by mouth once daily   No facility-administered encounter medications on file as of 10/14/2023.    Allergies  Allergen Reactions   Elemental Sulfur Rash    Family History, Prefers not to take   Sulfur Other (See Comments)    Family History, Prefers not to take    Review of Systems As per HPI  Objective:  BP 127/71   Pulse 76   Temp 98.2 F (36.8 C)   Ht 5\' 4"  (1.626 m)   Wt 145 lb (65.8 kg)   LMP 08/01/2015   SpO2 98%   BMI 24.89 kg/m    Wt Readings from Last 3 Encounters:  10/14/23 145 lb (65.8 kg)  08/19/23 146 lb 2 oz (66.3 kg)  07/14/23 144 lb (65.3 kg)   Physical Exam Constitutional:      General: She is awake. She is not in acute distress.    Appearance: Normal appearance. She is well-developed and well-groomed. She is not ill-appearing, toxic-appearing or diaphoretic.  Cardiovascular:     Rate and Rhythm: Normal rate and regular rhythm.     Pulses: Normal pulses.          Radial pulses are 2+ on the right side and 2+ on the left side.       Posterior tibial pulses are 2+ on the right side and 2+ on the  left side.     Heart sounds: Normal heart sounds. No murmur heard.    No gallop.  Pulmonary:     Effort: Pulmonary effort is normal. No respiratory distress.     Breath sounds: Normal breath sounds. No stridor. No wheezing, rhonchi or rales.  Musculoskeletal:     Cervical back: Full passive range of motion without pain and neck supple.     Right lower leg: No edema.     Left lower leg: No edema.  Skin:    General: Skin is warm.  Capillary Refill: Capillary refill takes less than 2 seconds.  Neurological:     General: No focal deficit present.     Mental Status: She is alert, oriented to person, place, and time and easily aroused. Mental status is at baseline.     GCS: GCS eye subscore is 4. GCS verbal subscore is 5. GCS motor subscore is 6.     Motor: No weakness.  Psychiatric:        Attention and Perception: Attention and perception normal.        Mood and Affect: Mood and affect normal.        Speech: Speech normal.        Behavior: Behavior normal. Behavior is cooperative.        Thought Content: Thought content normal. Thought content does not include homicidal or suicidal ideation. Thought content does not include homicidal or suicidal plan.        Cognition and Memory: Cognition and memory normal.        Judgment: Judgment normal.    Results for orders placed or performed in visit on 03/10/23  CBC with Differential/Platelet   Collection Time: 03/10/23 11:35 AM  Result Value Ref Range   WBC 6.4 3.4 - 10.8 x10E3/uL   RBC 4.02 3.77 - 5.28 x10E6/uL   Hemoglobin 13.1 11.1 - 15.9 g/dL   Hematocrit 82.9 56.2 - 46.6 %   MCV 98 (H) 79 - 97 fL   MCH 32.6 26.6 - 33.0 pg   MCHC 33.2 31.5 - 35.7 g/dL   RDW 13.0 86.5 - 78.4 %   Platelets 266 150 - 450 x10E3/uL   Neutrophils 42 Not Estab. %   Lymphs 45 Not Estab. %   Monocytes 10 Not Estab. %   Eos 2 Not Estab. %   Basos 1 Not Estab. %   Neutrophils Absolute 2.7 1.4 - 7.0 x10E3/uL   Lymphocytes Absolute 2.8 0.7 - 3.1  x10E3/uL   Monocytes Absolute 0.7 0.1 - 0.9 x10E3/uL   EOS (ABSOLUTE) 0.1 0.0 - 0.4 x10E3/uL   Basophils Absolute 0.1 0.0 - 0.2 x10E3/uL   Immature Granulocytes 0 Not Estab. %   Immature Grans (Abs) 0.0 0.0 - 0.1 x10E3/uL  CMP14+EGFR   Collection Time: 03/10/23 11:35 AM  Result Value Ref Range   Glucose 99 70 - 99 mg/dL   BUN 11 8 - 27 mg/dL   Creatinine, Ser 6.96 0.57 - 1.00 mg/dL   eGFR 88 >29 BM/WUX/3.24   BUN/Creatinine Ratio 14 12 - 28   Sodium 138 134 - 144 mmol/L   Potassium 4.2 3.5 - 5.2 mmol/L   Chloride 99 96 - 106 mmol/L   CO2 23 20 - 29 mmol/L   Calcium 9.6 8.7 - 10.3 mg/dL   Total Protein 7.1 6.0 - 8.5 g/dL   Albumin 4.6 3.9 - 4.9 g/dL   Globulin, Total 2.5 1.5 - 4.5 g/dL   Albumin/Globulin Ratio 1.8 1.2 - 2.2   Bilirubin Total 0.3 0.0 - 1.2 mg/dL   Alkaline Phosphatase 53 44 - 121 IU/L   AST 18 0 - 40 IU/L   ALT 14 0 - 32 IU/L  Lipid panel   Collection Time: 03/10/23 11:35 AM  Result Value Ref Range   Cholesterol, Total 141 100 - 199 mg/dL   Triglycerides 90 0 - 149 mg/dL   HDL 56 >40 mg/dL   VLDL Cholesterol Cal 17 5 - 40 mg/dL   LDL Chol Calc (NIH) 68 0 - 99 mg/dL  Chol/HDL Ratio 2.5 0.0 - 4.4 ratio  Thyroid Panel With TSH   Collection Time: 03/10/23 11:35 AM  Result Value Ref Range   TSH 4.090 0.450 - 4.500 uIU/mL   T4, Total 8.0 4.5 - 12.0 ug/dL   T3 Uptake Ratio 28 24 - 39 %   Free Thyroxine Index 2.2 1.2 - 4.9  VITAMIN D 25 Hydroxy (Vit-D Deficiency, Fractures)   Collection Time: 03/10/23 11:35 AM  Result Value Ref Range   Vit D, 25-Hydroxy 40.0 30.0 - 100.0 ng/mL       07/14/2023    2:06 PM 03/10/2023   11:13 AM 03/04/2023   11:18 AM 12/31/2022    3:46 PM 05/06/2022   12:24 PM  Depression screen PHQ 2/9  Decreased Interest 0 0 0 0 0  Down, Depressed, Hopeless 0 0 0 0 0  PHQ - 2 Score 0 0 0 0 0  Altered sleeping 0 0 0    Tired, decreased energy 0 0 0    Change in appetite 0 0 0    Feeling bad or failure about yourself  0 0 0    Trouble  concentrating 0 0 0    Moving slowly or fidgety/restless 0 0 0    Suicidal thoughts 0 0 0    PHQ-9 Score 0 0 0    Difficult doing work/chores Not difficult at all Not difficult at all Not difficult at all         07/14/2023    2:06 PM 03/10/2023   11:14 AM 03/04/2023   11:19 AM 01/30/2022   12:06 PM  GAD 7 : Generalized Anxiety Score  Nervous, Anxious, on Edge 0 0 0 0  Control/stop worrying 0 0 0 0  Worry too much - different things 0 0 0 0  Trouble relaxing 0 0 0 0  Restless 0 0 0 0  Easily annoyed or irritable 0 0 0 1  Afraid - awful might happen 0 0 0 0  Total GAD 7 Score 0 0 0 1  Anxiety Difficulty Not difficult at all Not difficult at all Not difficult at all Not difficult at all  The 10-year ASCVD risk score (Arnett DK, et al., 2019) is: 3.5%   Values used to calculate the score:     Age: 87 years     Sex: Female     Is Non-Hispanic African American: No     Diabetic: No     Tobacco smoker: No     Systolic Blood Pressure: 127 mmHg     Is BP treated: No     HDL Cholesterol: 56 mg/dL     Total Cholesterol: 141 mg/dL   Pertinent labs & imaging results that were available during my care of the patient were reviewed by me and considered in my medical decision making.  Assessment & Plan:  Kinjal was seen today for medical management of chronic issues.  Diagnoses and all orders for this visit:  Acquired hypothyroidism Labs as below. Will communicate results to patient once available. Will await results to determine next steps.  Refill provided  -     TSH -     levothyroxine (SYNTHROID) 75 MCG tablet; TAKE 1 TABLET BY MOUTH ONCE DAILY .  Hyperlipidemia LDL goal <130 Labs as below. Will communicate results to patient once available. Will await results to determine next steps.  Fasting Refill provided Reviewed ASCVD risk score   -     Lipid panel -     rosuvastatin (  CRESTOR) 5 MG tablet; Take 1 tablet (5 mg total) by mouth daily.  Chronic bilateral thoracic back  pain Well controlled. Continue current regimen.   Primary insomnia Patient to discuss with provider if she would like to start therapy in future.  -     CBC with Differential/Platelet -     CMP14+EGFR  Continue all other maintenance medications.  Follow up plan: Return in about 6 months (around 04/13/2024) for Chronic Condition Follow up.   Continue healthy lifestyle choices, including diet (rich in fruits, vegetables, and lean proteins, and low in salt and simple carbohydrates) and exercise (at least 30 minutes of moderate physical activity daily).  Written and verbal instructions provided   The above assessment and management plan was discussed with the patient. The patient verbalized understanding of and has agreed to the management plan. Patient is aware to call the clinic if they develop any new symptoms or if symptoms persist or worsen. Patient is aware when to return to the clinic for a follow-up visit. Patient educated on when it is appropriate to go to the emergency department.   Neale Burly, DNP-FNP [facility] [address] [place], [place] [address] [phone]

## 2023-10-15 LAB — CBC WITH DIFFERENTIAL/PLATELET
Basophils Absolute: 0.1 10*3/uL (ref 0.0–0.2)
Basos: 1 %
EOS (ABSOLUTE): 0.1 10*3/uL (ref 0.0–0.4)
Eos: 2 %
Hematocrit: 40.4 % (ref 34.0–46.6)
Hemoglobin: 13.5 g/dL (ref 11.1–15.9)
Immature Grans (Abs): 0 10*3/uL (ref 0.0–0.1)
Immature Granulocytes: 0 %
Lymphocytes Absolute: 2.7 10*3/uL (ref 0.7–3.1)
Lymphs: 41 %
MCH: 32.8 pg (ref 26.6–33.0)
MCHC: 33.4 g/dL (ref 31.5–35.7)
MCV: 98 fL — ABNORMAL HIGH (ref 79–97)
Monocytes Absolute: 0.6 10*3/uL (ref 0.1–0.9)
Monocytes: 9 %
Neutrophils Absolute: 3.1 10*3/uL (ref 1.4–7.0)
Neutrophils: 47 %
Platelets: 273 10*3/uL (ref 150–450)
RBC: 4.12 x10E6/uL (ref 3.77–5.28)
RDW: 11.3 % — ABNORMAL LOW (ref 11.7–15.4)
WBC: 6.6 10*3/uL (ref 3.4–10.8)

## 2023-10-15 LAB — CMP14+EGFR
ALT: 21 [IU]/L (ref 0–32)
AST: 24 [IU]/L (ref 0–40)
Albumin: 4.5 g/dL (ref 3.9–4.9)
Alkaline Phosphatase: 57 [IU]/L (ref 44–121)
BUN/Creatinine Ratio: 15 (ref 12–28)
BUN: 11 mg/dL (ref 8–27)
Bilirubin Total: 0.3 mg/dL (ref 0.0–1.2)
CO2: 23 mmol/L (ref 20–29)
Calcium: 9.6 mg/dL (ref 8.7–10.3)
Chloride: 102 mmol/L (ref 96–106)
Creatinine, Ser: 0.73 mg/dL (ref 0.57–1.00)
Globulin, Total: 2.6 g/dL (ref 1.5–4.5)
Glucose: 96 mg/dL (ref 70–99)
Potassium: 4.2 mmol/L (ref 3.5–5.2)
Sodium: 139 mmol/L (ref 134–144)
Total Protein: 7.1 g/dL (ref 6.0–8.5)
eGFR: 92 mL/min/{1.73_m2} (ref >59)

## 2023-10-15 LAB — LIPID PANEL
Chol/HDL Ratio: 2.9 {ratio} (ref 0.0–4.4)
Cholesterol, Total: 146 mg/dL (ref 100–199)
HDL: 50 mg/dL (ref >39)
LDL Chol Calc (NIH): 75 mg/dL (ref 0–99)
Triglycerides: 116 mg/dL (ref 0–149)
VLDL Cholesterol Cal: 21 mg/dL (ref 5–40)

## 2023-10-15 LAB — TSH: TSH: 5.42 u[IU]/mL — ABNORMAL HIGH (ref 0.450–4.500)

## 2023-10-15 NOTE — Progress Notes (Signed)
Can we add on a free t4?

## 2023-10-18 LAB — T4, FREE: Free T4: 1.22 ng/dL (ref 0.82–1.77)

## 2023-10-18 LAB — SPECIMEN STATUS REPORT

## 2023-10-19 NOTE — Progress Notes (Signed)
Free T4 within normal range. Recommend continuing current dose of synthroid. Will repeat in 3 months.

## 2024-02-14 LAB — COLOGUARD: COLOGUARD: NEGATIVE

## 2024-02-14 NOTE — Progress Notes (Signed)
 Negative cologuard. Repeat in 3 years

## 2024-02-19 ENCOUNTER — Other Ambulatory Visit: Payer: Self-pay | Admitting: Family Medicine

## 2024-02-19 DIAGNOSIS — E785 Hyperlipidemia, unspecified: Secondary | ICD-10-CM

## 2024-03-18 ENCOUNTER — Other Ambulatory Visit: Payer: Self-pay | Admitting: Family Medicine

## 2024-03-18 DIAGNOSIS — E039 Hypothyroidism, unspecified: Secondary | ICD-10-CM

## 2024-04-24 ENCOUNTER — Ambulatory Visit: Payer: Self-pay

## 2024-04-24 NOTE — Telephone Encounter (Signed)
 APPT MADE FOR TOMORROW WITH DOD

## 2024-04-24 NOTE — Telephone Encounter (Signed)
 FYI Only or Action Required?: FYI only for provider.  Patient was last seen in primary care on 10/14/2023 by Cathlene Marry Lenis, FNP. Called Nurse Triage reporting Chest Pain. Symptoms began several days ago. Interventions attempted: Nothing. Symptoms are: stable.  Triage Disposition: See Physician Within 24 Hours  Patient/caregiver understands and will follow disposition?: Yes       Copied from CRM [id number]. Topic: Clinical - Red Word Triage >> Apr 24, 2024  9:32 AM Delon HERO wrote: Red Word that prompted transfer to Nurse Triage: Patient is calling to report chest pain and headaches, no nausea, dizziness, stomach pain.  twice this week. Reason for Disposition  [1] Chest pain lasts < 5 minutes AND [2] NO chest pain or cardiac symptoms (e.g., breathing difficulty, sweating) now  (Exception: Chest pains that last only a few seconds.)  Answer Assessment - Initial Assessment Questions 1. LOCATION: Where does it hurt?       Middle of chest   2. RADIATION: Does the pain go anywhere else? (e.g., into neck, jaw, arms, back)     Does not radiate  3. ONSET: When did the chest pain begin? (Minutes, hours or days)      Started  last week  4. PATTERN: Does the pain come and go, or has it been constant since it started?  Does it get worse with exertion?      Comes and goes, happened twice last week  5. DURATION: How long does it last (e.g., seconds, minutes, hours)     Last about a minute or 2  6. SEVERITY: How bad is the pain?  (e.g., Scale 1-10; mild, moderate, or severe)    - MILD (1-3): doesn't interfere with normal activities     - MODERATE (4-7): interferes with normal activities or awakens from sleep    - SEVERE (8-10): excruciating pain, unable to do any normal activities       6/10  7. CARDIAC RISK FACTORS: Do you have any history of heart problems or risk factors for heart disease? (e.g., angina, prior heart attack; diabetes, high blood pressure, high  cholesterol, smoker, or strong family history of heart disease)     Family history heart disease, pt has hx of high cholesterol   8. PULMONARY RISK FACTORS: Do you have any history of lung disease?  (e.g., blood clots in lung, asthma, emphysema, birth control pills)     No  9. CAUSE: What do you think is causing the chest pain?     Unsure of cause, was sitting in church when pain struck, denies doing anything strenuous  10. OTHER SYMPTOMS: Do you have any other symptoms? (e.g., dizziness, nausea, vomiting, sweating, fever, difficulty breathing, cough)       Headaches  11. PREGNANCY: Is there any chance you are pregnant? When was your last menstrual period?       No  Protocols used: Chest Pain-A-AH

## 2024-04-25 ENCOUNTER — Encounter: Payer: Self-pay | Admitting: Family

## 2024-04-25 ENCOUNTER — Ambulatory Visit: Admitting: Family

## 2024-04-25 VITALS — BP 119/76 | HR 96 | Temp 97.8°F | Ht 64.0 in | Wt 143.6 lb

## 2024-04-25 DIAGNOSIS — Z8249 Family history of ischemic heart disease and other diseases of the circulatory system: Secondary | ICD-10-CM

## 2024-04-25 DIAGNOSIS — R079 Chest pain, unspecified: Secondary | ICD-10-CM

## 2024-04-25 DIAGNOSIS — E039 Hypothyroidism, unspecified: Secondary | ICD-10-CM | POA: Diagnosis not present

## 2024-04-25 DIAGNOSIS — E785 Hyperlipidemia, unspecified: Secondary | ICD-10-CM

## 2024-04-25 NOTE — Progress Notes (Signed)
 Subjective:    Patient ID: Amanda Hardy, female    DOB: 25-Mar-1961, 63 y.o.   MRN: [id number]  Chief Complaint  Patient presents with   Chest Pain   PT presents to the office today for chest pain that started for the last two Sunday. These episodes lasted a couple of mins.   She exercises 45 mins for five days a week. Denies any SOB or chest pain at this time.   She has a family history of CAD.  Chest Pain  This is a new problem. The current episode started 1 to 4 weeks ago. The onset quality is sudden. The problem has been waxing and waning. The pain is present in the substernal region. The pain is at a severity of 6/10. The pain is moderate. The quality of the pain is described as crushing. The pain does not radiate. Associated symptoms include diaphoresis and malaise/fatigue. Pertinent negatives include no cough, dizziness, exertional chest pressure, fever, headaches, irregular heartbeat, leg pain, lower extremity edema, nausea, near-syncope, palpitations, shortness of breath or vomiting.  Her past medical history is significant for hyperlipidemia and thyroid  problem.  Thyroid  Problem Presents for follow-up visit. Symptoms include diaphoresis and fatigue. Patient reports no hair loss or palpitations. The symptoms have been stable. Her past medical history is significant for hyperlipidemia.  Hyperlipidemia This is a chronic problem. The current episode started more than 1 year ago. The problem is controlled. Recent lipid tests were reviewed and are normal. Associated symptoms include chest pain. Pertinent negatives include no leg pain or shortness of breath. Current antihyperlipidemic treatment includes statins. The current treatment provides moderate improvement of lipids. Risk factors for coronary artery disease include dyslipidemia, a sedentary lifestyle and post-menopausal.      Review of Systems  Constitutional:  Positive for diaphoresis, fatigue and malaise/fatigue. Negative for  fever.  Respiratory:  Negative for cough and shortness of breath.   Cardiovascular:  Positive for chest pain. Negative for palpitations and near-syncope.  Gastrointestinal:  Negative for nausea and vomiting.  Neurological:  Negative for dizziness and headaches.  All other systems reviewed and are negative.   Social History   Socioeconomic History   Marital status: Married    Spouse name: Not on file   Number of children: Not on file   Years of education: Not on file   Highest education level: Not on file  Occupational History   Not on file  Tobacco Use   Smoking status: Former   Smokeless tobacco: Never  Vaping Use   Vaping status: Never Used  Substance and Sexual Activity   Alcohol use: No    Alcohol/week: 0.0 standard drinks of alcohol   Drug use: No   Sexual activity: Not on file  Other Topics Concern   Not on file  Social History Narrative   Lives with husband in a 2 story home.  Has 1 child.     Education: high school.  Housewife.     Social Drivers of Corporate investment banker Strain: Not on file  Food Insecurity: Not on file  Transportation Needs: Not on file  Physical Activity: Not on file  Stress: Not on file  Social Connections: Unknown (03/14/2022)   Received from [facility]   Social Network    Social Network: Not on file   Family History  Problem Relation Age of Onset   Hypertension Mother        Living, 7   Thyroid  disease Mother    Dementia  Father    Cancer Father        prostate   Thyroid  disease Brother    Thyroid  disease Sister    Healthy Daughter         Objective:   Physical Exam Vitals reviewed.  Constitutional:      General: She is not in acute distress.    Appearance: She is well-developed.  HENT:     Head: Normocephalic and atraumatic.     Right Ear: Tympanic membrane normal.     Left Ear: Tympanic membrane normal.   Eyes:     Pupils: Pupils are equal, round, and reactive to light.   Neck:     Thyroid : No  thyromegaly.   Cardiovascular:     Rate and Rhythm: Normal rate and regular rhythm.     Heart sounds: Normal heart sounds. No murmur heard. Pulmonary:     Effort: Pulmonary effort is normal. No respiratory distress.     Breath sounds: Normal breath sounds. No wheezing.  Abdominal:     General: Bowel sounds are normal. There is no distension.     Palpations: Abdomen is soft.     Tenderness: There is no abdominal tenderness.   Musculoskeletal:        General: No tenderness. Normal range of motion.     Cervical back: Normal range of motion and neck supple.   Skin:    General: Skin is warm and dry.   Neurological:     Mental Status: She is alert and oriented to person, place, and time.     Cranial Nerves: No cranial nerve deficit.     Deep Tendon Reflexes: Reflexes are normal and symmetric.   Psychiatric:        Behavior: Behavior normal.        Thought Content: Thought content normal.        Judgment: Judgment normal.      BP 119/76   Pulse 96   Temp 97.8 F (36.6 C) (Temporal)   Ht 5' 4 (1.626 m)   Wt 143 lb 9.6 oz (65.1 kg)   LMP 08/01/2015   SpO2 97%   BMI 24.65 kg/m      Assessment & Plan:  TELEAH VILLAMAR comes in today with chief complaint of Chest Pain   Diagnosis and orders addressed:  1. Chest pain, unspecified type (Primary) - EKG 12-Lead - Anemia Profile B - CMP14+EGFR - Lipid panel - Thyroid  Panel With TSH - Ambulatory referral to Cardiology  2. Family history of cardiac disorder in father - CMP14+EGFR - Ambulatory referral to Cardiology  3. Acquired hypothyroidism - CMP14+EGFR - Thyroid  Panel With TSH  4. Hyperlipidemia LDL goal <130 - CMP14+EGFR - Lipid panel   Labs pending Continue current medications  Urgent referral to Cardiologists pending  Health Maintenance reviewed Diet and exercise encouraged  No follow-ups on file.    Bari Learn, FNP

## 2024-04-25 NOTE — Patient Instructions (Signed)
Nonspecific Chest Pain, Adult Chest pain is an uncomfortable, tight, or painful feeling in the chest. The pain can feel like a crushing, aching, or squeezing pressure. A person can feel a burning or tingling sensation. Chest pain can also be felt in your back, neck, jaw, shoulder, or arm. This pain can be worse when you move, sneeze, or take a deep breath. Chest pain can be caused by a condition that is life-threatening. This must be treated right away. It can also be caused by something that is not life-threatening. If you have chest pain, it can be hard to know the difference, so it is important to get help right away to make sure that you do not have a serious condition. Some life-threatening causes of chest pain include: Heart attack. A tear in the body's main blood vessel (aortic dissection). Inflammation around your heart (pericarditis). A problem in the lungs, such as a blood clot (pulmonary embolism) or a collapsed lung (pneumothorax). Some non life-threatening causes of chest pain include: Heartburn. Anxiety or stress. Damage to the bones, muscles, and cartilage that make up your chest wall. Pneumonia or bronchitis. Shingles infection (varicella-zoster virus). Your chest pain may come and go. It may also be constant. Your health care provider will do tests and other studies to find the cause of your pain. Treatment will depend on the cause of your chest pain. Follow these instructions at home: Medicines Take over-the-counter and prescription medicines only as told by your health care provider. If you were prescribed an antibiotic medicine, take it as told by your health care provider. Do not stop taking the antibiotic even if you start to feel better. Activity Avoid any activities that cause chest pain. Do not lift anything that is heavier than 10 lb (4.5 kg), or the limit that you are told, until your health care provider says that it is safe. Rest as directed by your health care  provider. Return to your normal activities only as told by your health care provider. Ask your health care provider what activities are safe for you. Lifestyle     Do not use any products that contain nicotine or tobacco, such as cigarettes, e-cigarettes, and chewing tobacco. If you need help quitting, ask your health care provider. Do not drink alcohol. Make healthy lifestyle changes as recommended. These may include: Getting regular exercise. Ask your health care provider to suggest some exercises that are safe for you. Eating a heart-healthy diet. This includes plenty of fresh fruits and vegetables, whole grains, low-fat (lean) protein, and low-fat dairy products. A dietitian can help you find healthy eating options. Maintaining a healthy weight. Managing any other health conditions you may have, such as high blood pressure (hypertension) or diabetes. Reducing stress, such as with yoga or relaxation techniques. General instructions Pay attention to any changes in your symptoms. It is up to you to get the results of any tests that were done. Ask your health care provider, or the department that is doing the tests, when your results will be ready. Keep all follow-up visits as told by your health care provider. This is important. You may be asked to go for further testing if your chest pain does not go away. Contact a health care provider if: Your chest pain does not go away. You feel depressed. You have a fever. You notice changes in your symptoms or develop new symptoms. Get help right away if: Your chest pain gets worse. You have a cough that gets worse, or you  cough up blood. You have severe pain in your abdomen. You faint. You have sudden, unexplained chest discomfort. You have sudden, unexplained discomfort in your arms, back, neck, or jaw. You have shortness of breath at any time. You suddenly start to sweat, or your skin gets clammy. You feel nausea or you vomit. You  suddenly feel lightheaded or dizzy. You have severe weakness, or unexplained weakness or fatigue. Your heart begins to beat quickly, or it feels like it is skipping beats. These symptoms may represent a serious problem that is an emergency. Do not wait to see if the symptoms will go away. Get medical help right away. Call your local emergency services (911 in the [place] Do not drive yourself to the hospital. Summary Chest pain can be caused by a condition that is serious and requires urgent treatment. It may also be caused by something that is not life-threatening. Your health care provider may do lab tests and other studies to find the cause of your pain. Follow your health care provider's instructions on taking medicines, making lifestyle changes, and getting emergency treatment if symptoms become worse. Keep all follow-up visits as told by your health care provider. This includes visits for any further testing if your chest pain does not go away. This information is not intended to replace advice given to you by your health care provider. Make sure you discuss any questions you have with your health care provider. Document Revised: 09/03/2022 Document Reviewed: 09/03/2022 Elsevier Patient Education  2024 [employer].

## 2024-04-26 LAB — ANEMIA PROFILE B
Basophils Absolute: 0.1 10*3/uL (ref 0.0–0.2)
Basos: 1 %
EOS (ABSOLUTE): 0.1 10*3/uL (ref 0.0–0.4)
Eos: 1 %
Ferritin: 94 ng/mL (ref 15–150)
Folate: >20 ng/mL (ref >3.0)
Hematocrit: 39.1 % (ref 34.0–46.6)
Hemoglobin: 12.9 g/dL (ref 11.1–15.9)
Immature Grans (Abs): 0 10*3/uL (ref 0.0–0.1)
Immature Granulocytes: 0 %
Iron Saturation: 33 % (ref 15–55)
Iron: 105 ug/dL (ref 27–139)
Lymphocytes Absolute: 3 10*3/uL (ref 0.7–3.1)
Lymphs: 42 %
MCH: 32.7 pg (ref 26.6–33.0)
MCHC: 33 g/dL (ref 31.5–35.7)
MCV: 99 fL — ABNORMAL HIGH (ref 79–97)
Monocytes Absolute: 0.6 10*3/uL (ref 0.1–0.9)
Monocytes: 8 %
Neutrophils Absolute: 3.3 10*3/uL (ref 1.4–7.0)
Neutrophils: 48 %
Platelets: 248 10*3/uL (ref 150–450)
RBC: 3.94 x10E6/uL (ref 3.77–5.28)
RDW: 11.8 % (ref 11.7–15.4)
Retic Ct Pct: 1.5 % (ref 0.6–2.6)
Total Iron Binding Capacity: 317 ug/dL (ref 250–450)
UIBC: 212 ug/dL (ref 118–369)
Vitamin B-12: >2000 pg/mL — ABNORMAL HIGH (ref 232–1245)
WBC: 7.1 10*3/uL (ref 3.4–10.8)

## 2024-04-26 LAB — LIPID PANEL
Chol/HDL Ratio: 3 ratio (ref 0.0–4.4)
Cholesterol, Total: 145 mg/dL (ref 100–199)
HDL: 48 mg/dL (ref >39)
LDL Chol Calc (NIH): 73 mg/dL (ref 0–99)
Triglycerides: 140 mg/dL (ref 0–149)
VLDL Cholesterol Cal: 24 mg/dL (ref 5–40)

## 2024-04-26 LAB — CMP14+EGFR
ALT: 16 IU/L (ref 0–32)
AST: 22 IU/L (ref 0–40)
Albumin: 4.6 g/dL (ref 3.9–4.9)
Alkaline Phosphatase: 58 IU/L (ref 44–121)
BUN/Creatinine Ratio: 15 (ref 12–28)
BUN: 10 mg/dL (ref 8–27)
Bilirubin Total: 0.3 mg/dL (ref 0.0–1.2)
CO2: 17 mmol/L — ABNORMAL LOW (ref 20–29)
Calcium: 9.6 mg/dL (ref 8.7–10.3)
Chloride: 99 mmol/L (ref 96–106)
Creatinine, Ser: 0.67 mg/dL (ref 0.57–1.00)
Globulin, Total: 2.5 g/dL (ref 1.5–4.5)
Glucose: 96 mg/dL (ref 70–99)
Potassium: 3.8 mmol/L (ref 3.5–5.2)
Sodium: 137 mmol/L (ref 134–144)
Total Protein: 7.1 g/dL (ref 6.0–8.5)
eGFR: 98 mL/min/{1.73_m2} (ref >59)

## 2024-04-26 LAB — THYROID PANEL WITH TSH
Free Thyroxine Index: 2.3 (ref 1.2–4.9)
T3 Uptake Ratio: 27 % (ref 24–39)
T4, Total: 8.6 ug/dL (ref 4.5–12.0)
TSH: 2.7 u[IU]/mL (ref 0.450–4.500)

## 2024-04-27 ENCOUNTER — Ambulatory Visit: Payer: Self-pay | Admitting: Family

## 2024-04-27 ENCOUNTER — Telehealth: Payer: Self-pay

## 2024-04-27 NOTE — Telephone Encounter (Signed)
 noted

## 2024-04-27 NOTE — Telephone Encounter (Signed)
 Copied from CRM [id number]. Topic: Clinical - Lab/Test Results >> Apr 27, 2024  2:58 PM Avram MATSU wrote: Reason for CRM: I relayed the test results to the patient

## 2024-05-15 ENCOUNTER — Ambulatory Visit: Attending: Physician Assistant | Admitting: Physician Assistant

## 2024-05-15 ENCOUNTER — Encounter: Payer: Self-pay | Admitting: Physician Assistant

## 2024-05-15 VITALS — BP 137/82 | HR 84 | Ht 65.0 in | Wt 143.0 lb

## 2024-05-15 DIAGNOSIS — E785 Hyperlipidemia, unspecified: Secondary | ICD-10-CM | POA: Insufficient documentation

## 2024-05-15 DIAGNOSIS — R072 Precordial pain: Secondary | ICD-10-CM | POA: Diagnosis not present

## 2024-05-15 NOTE — Progress Notes (Signed)
 OFFICE NOTE:    Date:  05/15/2024  ID:  Amanda Hardy, DOB 05-Jun-1961, MRN [id number] PCP: Cathlene Marry Lenis, FNP (Inactive)  [facility] Providers Cardiologist:  None        Hyperlipidemia  Hypothyroidism GERD      Discussed the use of AI scribe software for clinical note transcription with the patient, who gave verbal consent to proceed. History of Present Illness Amanda Hardy is a 63 y.o. female who is referred by Lavell Bari LABOR, FNP for evaluation of chest pain.  She experienced chest pain on two consecutive Sundays, approximately three weeks ago. The pain was described as crushing and intense, lasting about a minute each time. The first episode occurred while she was preparing breakfast, and the second episode happened while she was in church. She was not engaged in any physical exertion during these episodes. No shortness of breath was noted, but she experienced increased sweating, particularly on her face, which she found unusual.  Her family history is significant for heart disease; her father died of a heart attack at the age of 62.   She has a history of smoking from age 57 to 61 but has not smoked since. She denies any alcohol or drug use and has been a housewife for 47 years. She exercises regularly, walking two miles five days a week without any exertional symptoms.    Review of Systems  Constitutional: Negative for fever.  Cardiovascular:  Negative for claudication.  Respiratory:  Negative for cough.   Gastrointestinal:  Negative for hematochezia and melena.  Genitourinary:  Negative for hematuria.  -See HPI    Studies Reviewed:      04/27/2024: Hgb 12.9, creatinine 0.67, potassium 3.8, ALT 16, total cholesterol 145, triglycerides 140, HDL 48, LDL 73, TSH 2.7  EKG ordered by primary care done 04/25/2024 personally reviewed and interpreted by me today 05/15/2024: NSR, normal axis, QTc 395, no ST-T wave changes          Physical Exam:  VS:   BP 137/82   Pulse 84   Ht 5' 5 (1.651 m)   Wt 143 lb (64.9 kg)   LMP 08/01/2015   SpO2 97%   BMI 23.80 kg/m        Wt Readings from Last 3 Encounters:  05/15/24 143 lb (64.9 kg)  04/25/24 143 lb 9.6 oz (65.1 kg)  10/14/23 145 lb (65.8 kg)    Constitutional:      Appearance: Healthy appearance. Not in distress.  Neck:     Vascular: No carotid bruit or JVR. JVD normal.  Pulmonary:     Breath sounds: Normal breath sounds. No wheezing. No rales.  Cardiovascular:     Normal rate. Regular rhythm.     Murmurs: There is no murmur.  Edema:    Peripheral edema absent.  Abdominal:     Palpations: Abdomen is soft.       Assessment and Plan:    Assessment & Plan Precordial chest pain She had 2 episodes of chest discomfort about 3 weeks ago.  She has not had a recurrence since.  The symptoms were intense but only lasted a couple of minutes.  She is very active and exercises on a regular basis without chest discomfort, shortness of breath or symptoms of exercise intolerance.  Her EKG did not demonstrate any acute abnormalities.  She does have a family history of coronary artery disease and a remote history of smoking. She is most concerned about her  family history. With her good functional capacity and absence of symptoms with exertion, I am not convinced she needs stress testing or ischemia evaluation at this point.  - Obtain echocardiogram to rule out structural heart disease - Arrange Coronary artery Ca2+  - If CAC is elevated, will pursue stress testing vs CCTA  - Follow up with Dr. Lavona in [place] in 3 mos.  Hyperlipidemia LDL goal <130 LDL cholesterol well controlled with current medication regimen. - Continue rosuvastatin  5 mg daily. - Will need to aim for LDL < 70 if she has significant CAC on CT        Dispo:  Return for 2-3 MONTHS DR. HOCHREIN IN [place] OFFICE.  Signed, Glendia Ferrier, PA-C

## 2024-05-15 NOTE — Assessment & Plan Note (Signed)
 LDL cholesterol well controlled with current medication regimen. - Continue rosuvastatin  5 mg daily. - Will need to aim for LDL < 70 if she has significant CAC on CT

## 2024-05-15 NOTE — Patient Instructions (Signed)
 Medication Instructions:  Your physician recommends that you continue on your current medications as directed. Please refer to the Current Medication list given to you today.  *If you need a refill on your cardiac medications before your next appointment, please call your pharmacy*  Lab Work: None ordered  If you have labs (blood work) drawn today and your tests are completely normal, you will receive your results only by: MyChart Message (if you have MyChart) OR A paper copy in the mail If you have any lab test that is abnormal or we need to change your treatment, we will call you to review the results.  Testing/Procedures: Your physician has requested that you have an echocardiogram. Echocardiography is a painless test that uses sound waves to create images of your heart. It provides your doctor with information about the size and shape of your heart and how well your heart's chambers and valves are working. This procedure takes approximately one hour. There are no restrictions for this procedure. Please do NOT wear cologne, perfume, aftershave, or lotions (deodorant is allowed). Please arrive 15 minutes prior to your appointment time.  Please note: We ask at that you not bring children with you during ultrasound (echo/ vascular) testing. Due to room size and safety concerns, children are not allowed in the ultrasound rooms during exams. Our front office staff cannot provide observation of children in our lobby area while testing is being conducted. An adult accompanying a patient to their appointment will only be allowed in the ultrasound room at the discretion of the ultrasound technician under special circumstances. We apologize for any inconvenience.   Your physician recommends you have a CT Calcium  Score.  This is a self pay study, $99.00, that is not billed through [employer].  Follow-Up: At [facility], you and your health needs are our priority.  As part of our continuing  mission to provide you with exceptional heart care, our providers are all part of one team.  This team includes your primary Cardiologist (physician) and Advanced Practice Providers or APPs (Physician Assistants and Nurse Practitioners) who all work together to provide you with the care you need, when you need it.  Your next appointment:   2-3 month(s)  Provider:   Lynwood Schilling, MD    We recommend signing up for the patient portal called MyChart.  Sign up information is provided on this After Visit Summary.  MyChart is used to connect with patients for Virtual Visits (Telemedicine).  Patients are able to view lab/test results, encounter notes, upcoming appointments, etc.  Non-urgent messages can be sent to your provider as well.   To learn more about what you can do with MyChart, go to [email or web address].   Other Instructions

## 2024-05-22 ENCOUNTER — Other Ambulatory Visit: Payer: Self-pay | Admitting: *Deleted

## 2024-05-22 DIAGNOSIS — E785 Hyperlipidemia, unspecified: Secondary | ICD-10-CM

## 2024-05-22 MED ORDER — ROSUVASTATIN CALCIUM 5 MG PO TABS: 5.0000 mg | ORAL_TABLET | Freq: Every day | ORAL | 0 refills | Status: DC

## 2024-06-01 ENCOUNTER — Other Ambulatory Visit: Payer: Self-pay | Admitting: *Deleted

## 2024-06-01 DIAGNOSIS — E039 Hypothyroidism, unspecified: Secondary | ICD-10-CM

## 2024-06-01 MED ORDER — LEVOTHYROXINE SODIUM 75 MCG PO TABS: 75.0000 ug | ORAL_TABLET | Freq: Every day | ORAL | 3 refills | Status: AC

## 2024-06-15 ENCOUNTER — Ambulatory Visit (INDEPENDENT_AMBULATORY_CARE_PROVIDER_SITE_OTHER)

## 2024-06-15 ENCOUNTER — Ambulatory Visit (HOSPITAL_BASED_OUTPATIENT_CLINIC_OR_DEPARTMENT_OTHER)
Admission: RE | Admit: 2024-06-15 | Discharge: 2024-06-15 | Disposition: A | Discharge status: Home / Self Care | Payer: Self-pay | Source: Ambulatory Visit | Attending: Physician Assistant | Admitting: Physician Assistant

## 2024-06-15 DIAGNOSIS — R072 Precordial pain: Secondary | ICD-10-CM

## 2024-06-15 LAB — ECHOCARDIOGRAM COMPLETE
Area-P 1/2: 4.08 cm2
S' Lateral: 2.27 cm

## 2024-06-16 ENCOUNTER — Encounter: Payer: Self-pay | Admitting: Physician Assistant

## 2024-06-16 ENCOUNTER — Ambulatory Visit: Payer: Self-pay | Admitting: Physician Assistant

## 2024-06-16 DIAGNOSIS — E785 Hyperlipidemia, unspecified: Secondary | ICD-10-CM

## 2024-06-16 DIAGNOSIS — I2510 Atherosclerotic heart disease of native coronary artery without angina pectoris: Secondary | ICD-10-CM | POA: Insufficient documentation

## 2024-06-16 MED ORDER — ROSUVASTATIN CALCIUM 10 MG PO TABS: 10.0000 mg | ORAL_TABLET | Freq: Every day | ORAL | 3 refills | Status: DC

## 2024-06-16 NOTE — Telephone Encounter (Signed)
 Patient is returning call.

## 2024-06-19 ENCOUNTER — Encounter: Payer: Self-pay | Admitting: Family Medicine

## 2024-06-19 ENCOUNTER — Ambulatory Visit: Payer: Self-pay

## 2024-06-19 ENCOUNTER — Ambulatory Visit: Admitting: Family Medicine

## 2024-06-19 VITALS — BP 116/73 | HR 88 | Temp 97.8°F | Ht 65.0 in | Wt 143.8 lb

## 2024-06-19 DIAGNOSIS — L2084 Intrinsic (allergic) eczema: Secondary | ICD-10-CM

## 2024-06-19 DIAGNOSIS — R21 Rash and other nonspecific skin eruption: Secondary | ICD-10-CM | POA: Diagnosis not present

## 2024-06-19 MED ORDER — MOMETASONE FUROATE 0.1 % EX CREA: TOPICAL_CREAM | Freq: Every day | CUTANEOUS | 1 refills | Status: AC

## 2024-06-19 MED ORDER — BETAMETHASONE SOD PHOS & ACET 6 (3-3) MG/ML IJ SUSP
6.0000 mg | Freq: Once | INTRAMUSCULAR | Status: AC
  Administered 2024-06-19: 6 mg via INTRAMUSCULAR
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
## 2024-06-19 NOTE — Telephone Encounter (Signed)
 FYI Only or Action Required?: Action required by provider: request for appointment.  Patient was last seen in primary care on 04/25/2024 by Lavell Bari LABOR, FNP.  Called Nurse Triage reporting Rash.  Symptoms began several months ago.  Interventions attempted: Nothing.  Symptoms are: unchanged.  Triage Disposition: See Physician Within 24 Hours  Patient/caregiver understands and will follow disposition?: YesCopied from CRM #[id number]. Topic: Clinical - Red Word Triage >> Jun 19, 2024  8:50 AM [name] wrote: Red Word that prompted transfer to Nurse Triage: Patient has a rash under both her eyes. Patient has been having some burning , itching and swelling for the last few days. Reason for Disposition  [1] Localized rash is very painful AND [2] no fever  Answer Assessment - Initial Assessment Questions Pt has had same symptoms several months ago and was referred to dermatologist but never got referral. Washing face makes rash burn worse.Rash is on neck too    1. APPEARANCE of RASH: What does the rash look like? (e.g., blisters, dry flaky skin, red spots, redness, sores)     Red and dry 2. LOCATION: Where is the rash located?      Under both eyes 3. NUMBER: How many spots are there?      unsure 4. SIZE: How big are the spots? (e.g., inches, cm; or compare to size of pinhead, tip of pen, eraser, pea)      tiny 5. ONSET: When did the rash start?      Few months ago 6. ITCHING: Does the rash itch? If Yes, ask: How bad is the itch?  (Scale 0-10; or none, mild, moderate, severe)     Mild-moderate 7. PAIN: Does the rash hurt? If Yes, ask: How bad is the pain?  (Scale 0-10; or none, mild, moderate, severe)     Burns under eyes 8. OTHER SYMPTOMS: Do you have any other symptoms? (e.g., fever)     Swelling under eyes  Protocols used: Rash or Redness - Localized-A-AH

## 2024-06-19 NOTE — Telephone Encounter (Signed)
 Appt made for today

## 2024-07-10 ENCOUNTER — Telehealth: Payer: Self-pay | Admitting: Physician Assistant

## 2024-07-10 DIAGNOSIS — E785 Hyperlipidemia, unspecified: Secondary | ICD-10-CM

## 2024-07-10 MED ORDER — ROSUVASTATIN CALCIUM 5 MG PO TABS: 5.0000 mg | ORAL_TABLET | Freq: Every day | ORAL | Status: DC

## 2024-07-10 NOTE — Telephone Encounter (Signed)
 Ever since being on the increased dose of Rosuvastatin - increased from 5 to 10mg ; she reports headache and severe brain fog. She said she is just in a fog, stupid. She said it is too high of a dose for her and she plans to decrease to previous dose, she will cut the pills in half.   Informed her that I would let her provider know and we would get back to her with any recommendations. She verbalized understanding.

## 2024-07-10 NOTE — Telephone Encounter (Signed)
 Ok. Resume Rosuvastatin  5 mg once daily. Glendia Ferrier, PA-C    07/10/2024 4:18 PM
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
## 2024-07-26 NOTE — Progress Notes (Deleted)
  Cardiology Office Note:   Date:  07/26/2024  ID:  Amanda Hardy, DOB 1960-12-07, MRN [id number] PCP: Cathlene Marry Lenis, FNP  [facility] Providers Cardiologist:  None {  History of Present Illness:   Amanda Hardy is a 63 y.o. female for evaluation of chest pain.  She had mild coronary calcium .    Echo was unremarkable.   ***   ROS: ***  Studies Reviewed:    EKG:       ***  Risk Assessment/Calculations:   {Does this patient have ATRIAL FIBRILLATION?:[phone]} No BP recorded.  {Refresh Note OR Click here to enter BP  :1}***        Physical Exam:   VS:  LMP 08/01/2015    Wt Readings from Last 3 Encounters:  06/19/24 143 lb 12.8 oz (65.2 kg)  05/15/24 143 lb (64.9 kg)  04/25/24 143 lb 9.6 oz (65.1 kg)     GEN: Well nourished, well developed in no acute distress NECK: No JVD; No carotid bruits CARDIAC: ***RR, *** murmurs, rubs, gallops RESPIRATORY:  Clear to auscultation without rales, wheezing or rhonchi  ABDOMEN: Soft, non-tender, non-distended EXTREMITIES:  No edema; No deformity   ASSESSMENT AND PLAN:   Precordial chest pain:   ***   She had 2 episodes of chest discomfort about 3 weeks ago.  She has not had a recurrence since.  The symptoms were intense but only lasted a couple of minutes.  She is very active and exercises on a regular basis without chest discomfort, shortness of breath or symptoms of exercise intolerance.  Her EKG did not demonstrate any acute abnormalities.  She does have a family history of coronary artery disease and a remote history of smoking. She is most concerned about her family history. With her good functional capacity and absence of symptoms with exertion, I am not convinced she needs stress testing or ischemia evaluation at this point.  - Obtain echocardiogram to rule out structural heart disease - Arrange Coronary artery Ca2+  - If CAC is elevated, will pursue stress testing vs CCTA  - Follow up with Dr. Lavona in  [place] in 3 mos.   Hyperlipidemia :   ***  LDL goal <130 LDL cholesterol well controlled with current medication regimen. - Continue rosuvastatin  5 mg daily. - Will need to aim for LDL < 70 if she has significant CAC on CT     Follow up ***  Signed, Lynwood Lavona, MD

## 2024-07-27 ENCOUNTER — Ambulatory Visit: Attending: Cardiology | Admitting: Cardiology

## 2024-07-28 ENCOUNTER — Encounter: Payer: Self-pay | Admitting: Cardiology

## 2024-08-09 ENCOUNTER — Ambulatory Visit (INDEPENDENT_AMBULATORY_CARE_PROVIDER_SITE_OTHER)

## 2024-08-09 ENCOUNTER — Ambulatory Visit: Admitting: Family Medicine

## 2024-08-09 ENCOUNTER — Encounter: Payer: Self-pay | Admitting: Family Medicine

## 2024-08-09 ENCOUNTER — Other Ambulatory Visit: Payer: Self-pay | Admitting: Family Medicine

## 2024-08-09 VITALS — BP 131/78 | HR 86 | Temp 98.4°F | Ht 65.0 in | Wt 143.8 lb

## 2024-08-09 DIAGNOSIS — Z13820 Encounter for screening for osteoporosis: Secondary | ICD-10-CM

## 2024-08-09 DIAGNOSIS — E039 Hypothyroidism, unspecified: Secondary | ICD-10-CM | POA: Diagnosis not present

## 2024-08-09 DIAGNOSIS — E785 Hyperlipidemia, unspecified: Secondary | ICD-10-CM

## 2024-08-09 DIAGNOSIS — K7689 Other specified diseases of liver: Secondary | ICD-10-CM

## 2024-08-09 DIAGNOSIS — Z780 Asymptomatic menopausal state: Secondary | ICD-10-CM

## 2024-08-09 NOTE — Progress Notes (Signed)
 Established Patient Office Visit  Subjective   Patient ID: Amanda Hardy, female    DOB: 03/17/1961  Age: 63 y.o. MRN: [id number]  Chief Complaint  Patient presents with   Medical Management of Chronic Issues    HPI  History of Present Illness   Amanda Hardy is a 63 year old female who presents for a bone density scan and establishment of care.  Bone health and osteoporosis risk - Family history of osteoporosis; mother had osteoporosis and suffered a hip fracture - Proactive about bone health and presents for bone density scan - Remains physically active through treadmill use, yard work, and housework - Does not take a calcium  supplement  Hyperlipidemia - Managed with low dose rosuvastatin  - Diet is fairly healthy  Hypothyroidism - Family history of hypothyroidism affecting mother, siblings, and daughter - No history of thyroidectomy or radiation therapy - No changes in hair, skin, nails, appetite, mood, tremor, or sleep related to thyroid  condition  Hepatic cysts and right upper back pain - Recent CT calcium  scoring revealed cysts on liver - Occasional right upper back pain attributed to leaning or pressure - No associated nausea or vomiting, abdominal pain        ROS As per HPI.    Objective:     BP 131/78   Pulse 86   Temp 98.4 F (36.9 C) (Temporal)   Ht 5' 5 (1.651 m)   Wt 143 lb 12.8 oz (65.2 kg)   LMP 08/01/2015   SpO2 96%   BMI 23.93 kg/m  Wt Readings from Last 3 Encounters:  08/09/24 143 lb 12.8 oz (65.2 kg)  06/19/24 143 lb 12.8 oz (65.2 kg)  05/15/24 143 lb (64.9 kg)      Physical Exam Vitals and nursing note reviewed.  Constitutional:      General: She is not in acute distress.    Appearance: Normal appearance. She is not ill-appearing.  Cardiovascular:     Rate and Rhythm: Normal rate and regular rhythm.     Pulses: Normal pulses.     Heart sounds: Normal heart sounds. No murmur heard. Pulmonary:     Effort: Pulmonary effort  is normal. No respiratory distress.     Breath sounds: Normal breath sounds.  Abdominal:     General: Bowel sounds are normal. There is no distension.     Palpations: Abdomen is soft. There is no mass.     Tenderness: There is no abdominal tenderness. There is no guarding or rebound.  Musculoskeletal:     Cervical back: Neck supple. No tenderness.     Right lower leg: No edema.     Left lower leg: No edema.  Lymphadenopathy:     Cervical: No cervical adenopathy.  Skin:    General: Skin is warm and dry.  Neurological:     General: No focal deficit present.     Mental Status: She is alert and oriented to person, place, and time.  Psychiatric:        Mood and Affect: Mood normal.        Behavior: Behavior normal.      No results found for any visits on 08/09/24.    The 10-year ASCVD risk score (Arnett DK, et al., 2019) is: 4.6%    Assessment & Plan:   Amanda Hardy was seen today for medical management of chronic issues.  Diagnoses and all orders for this visit:  Postmenopausal -     DG [facility] DEXA  Hepatic cyst -  Ambulatory referral to Gastroenterology  Acquired hypothyroidism  Hyperlipidemia LDL goal <130   Assessment and Plan    Liver cysts Hepatic cysts measuring up to 2.2 cm per CT calcium  scoring. No significant symptoms noted. LFTs have been normal.  - Refer to gastroenterologist for evaluation.  Hypothyroidism Well-managed. No recent symptom changes.  Hyperlipidemia Managed with low-dose rosuvastatin . Diet generally healthy, physically active  Postmenopausal DEXA today.       Return in about 6 months (around 02/07/2025) for CPE.   The patient indicates understanding of these issues and agrees with the plan.  Amanda CHRISTELLA Search, FNP

## 2024-08-14 ENCOUNTER — Ambulatory Visit: Payer: Self-pay | Admitting: Family Medicine

## 2024-08-21 ENCOUNTER — Encounter: Payer: Self-pay | Admitting: Gastroenterology

## 2024-08-21 ENCOUNTER — Other Ambulatory Visit: Payer: Self-pay | Admitting: Family Medicine

## 2024-08-21 DIAGNOSIS — Z1231 Encounter for screening mammogram for malignant neoplasm of breast: Secondary | ICD-10-CM

## 2024-08-23 ENCOUNTER — Ambulatory Visit
Admission: RE | Admit: 2024-08-23 | Discharge: 2024-08-23 | Disposition: A | Discharge status: Home / Self Care | Source: Ambulatory Visit | Attending: Family Medicine | Admitting: Family Medicine

## 2024-08-23 DIAGNOSIS — Z1231 Encounter for screening mammogram for malignant neoplasm of breast: Secondary | ICD-10-CM

## 2024-08-29 ENCOUNTER — Other Ambulatory Visit: Payer: Self-pay | Admitting: Family Medicine

## 2024-08-29 DIAGNOSIS — R928 Other abnormal and inconclusive findings on diagnostic imaging of breast: Secondary | ICD-10-CM

## 2024-09-11 ENCOUNTER — Ambulatory Visit
Admission: RE | Admit: 2024-09-11 | Discharge: 2024-09-11 | Disposition: A | Source: Ambulatory Visit | Attending: Family Medicine

## 2024-09-11 ENCOUNTER — Ambulatory Visit
Admission: RE | Admit: 2024-09-11 | Discharge: 2024-09-11 | Disposition: A | Discharge status: Home / Self Care | Source: Ambulatory Visit | Attending: Family Medicine | Admitting: Family Medicine

## 2024-09-11 DIAGNOSIS — R928 Other abnormal and inconclusive findings on diagnostic imaging of breast: Secondary | ICD-10-CM

## 2024-09-12 ENCOUNTER — Other Ambulatory Visit: Payer: Self-pay | Admitting: Family Medicine

## 2024-09-12 DIAGNOSIS — N6320 Unspecified lump in the left breast, unspecified quadrant: Secondary | ICD-10-CM

## 2024-09-15 ENCOUNTER — Ambulatory Visit
Admission: RE | Admit: 2024-09-15 | Discharge: 2024-09-15 | Disposition: A | Discharge status: Home / Self Care | Source: Ambulatory Visit | Attending: Family Medicine | Admitting: Family Medicine

## 2024-09-15 ENCOUNTER — Other Ambulatory Visit: Payer: Self-pay

## 2024-09-15 DIAGNOSIS — N6320 Unspecified lump in the left breast, unspecified quadrant: Secondary | ICD-10-CM

## 2024-09-15 HISTORY — PX: BREAST BIOPSY: SHX20

## 2024-09-15 LAB — LIPID PANEL
Chol/HDL Ratio: 3.1 ratio (ref 0.0–4.4)
Cholesterol, Total: 162 mg/dL (ref 100–199)
HDL: 53 mg/dL (ref >39)
LDL Chol Calc (NIH): 87 mg/dL (ref 0–99)
Triglycerides: 127 mg/dL (ref 0–149)
VLDL Cholesterol Cal: 22 mg/dL (ref 5–40)

## 2024-09-15 LAB — ALT: ALT: 18 IU/L (ref 0–32)

## 2024-09-17 MED ORDER — EZETIMIBE 10 MG PO TABS
10.0000 mg | ORAL_TABLET | Freq: Every day | ORAL | 11 refills | Status: AC
Start: 2024-09-17 — End: 2024-12-16

## 2024-09-17 NOTE — Addendum Note (Signed)
 Addended byBETHA FERRIER, GLENDIA T on: 09/17/2024 10:30 PM   Modules accepted: Orders

## 2024-09-18 ENCOUNTER — Other Ambulatory Visit: Payer: Self-pay | Admitting: *Deleted

## 2024-09-18 DIAGNOSIS — R0609 Other forms of dyspnea: Secondary | ICD-10-CM

## 2024-09-18 DIAGNOSIS — R062 Wheezing: Secondary | ICD-10-CM

## 2024-09-18 LAB — SURGICAL PATHOLOGY

## 2024-09-25 ENCOUNTER — Telehealth: Payer: Self-pay | Admitting: *Deleted

## 2024-09-25 ENCOUNTER — Encounter: Payer: Self-pay | Admitting: Gastroenterology

## 2024-09-25 ENCOUNTER — Ambulatory Visit (INDEPENDENT_AMBULATORY_CARE_PROVIDER_SITE_OTHER): Admitting: Gastroenterology

## 2024-09-25 VITALS — BP 126/79 | HR 88 | Temp 98.4°F | Ht 65.0 in | Wt 144.8 lb

## 2024-09-25 DIAGNOSIS — K5900 Constipation, unspecified: Secondary | ICD-10-CM | POA: Insufficient documentation

## 2024-09-25 DIAGNOSIS — K7689 Other specified diseases of liver: Secondary | ICD-10-CM | POA: Insufficient documentation

## 2024-09-25 NOTE — Telephone Encounter (Signed)
 Pt informed of US  appt date,time.location and instructions.

## 2024-09-25 NOTE — Patient Instructions (Signed)
 For liver cysts: We will obtain ultrasound of your liver since entire liver not seen on heart CT.  For constipation: Increase fluid intake to at least 64 ounces daily Consume 5 servings of vegetables/fruit daily Try eating one golden Kiwi daily Continue daily exercise You can add miralax 1/2 to 1 capful in 6 ounces of fluid daily to soften stool if dietary measures does not consistently improve your symptoms.  Colon cancer screening: Continue Cologuard every 3 years with your PCP.

## 2024-09-25 NOTE — Telephone Encounter (Signed)
 LMOVM to return call  US  scheduled for Friday Dec 5th, arrive at 10:15 am, NPO after midnight.

## 2024-09-25 NOTE — Progress Notes (Signed)
 GI Office Note    Referring Provider: Joesph Annabella HERO, FNP Primary Care Physician:  Joesph Annabella HERO, FNP  Primary Gastroenterologist: Carlin POUR. Cindie, DO   Chief Complaint   Chief Complaint  Patient presents with   Constipation    Has issues with constipation and was told there is a cyst on her liver     History of Present Illness   Amanda Hardy is a 63 y.o. female presenting today at the request of Annabella Joesph, FNP for hepatic cysts seen on CT cardiac scoring exam.   Discussed the use of AI scribe software for clinical note transcription with the patient, who gave verbal consent to proceed.  History of Present Illness Amanda Hardy is a 63 year old female who presents with liver cysts found incidentally during a heart scan. She was referred by her primary care physician for evaluation of liver cysts.  A recent heart scan incidentally revealed multiple liver cysts, with the largest being small in size, 2.2cm. The scan was not intended to visualize the liver fully, and therefore was only partially imaged.   She also describes experiencing back pain, initially thought to be related to her kidneys, but she reports her PCP did an x-ray revealed constipation instead.  She has been experiencing constipation for the past seven to eight years, with bowel movements occurring once daily but described as dry and hard, [place] 1. She has not tried many treatments due to a dislike of taking medications, but she has recently started eating two apples a day. Her family history includes constipation affecting her mother, daughter, and granddaughter.  She experiences acid reflux triggered by acidic foods like tomatoes and pizza sauce, which she manages with antacids. No abdominal pain or blood in her stool. She has not undergone a colonoscopy but completed a Cologuard test last year with normal results.      Prior Data   Results   Labs dated 09/15/2024: ALT 18, total  cholesterol 162, LDL 87 Labs dated April 25, 2024: Albumin 4.6, total bilirubin 0.3, alk phos 58, AST 22, ALT 16, creatinine 0.67, total iron binding capacity 317, iron 105, iron sat 33, ferritin 94, B12 greater than 2000, folate greater than 20, hemoglobin 12.9, MCV 99, platelets 248  CT cardiac scoring 06/15/24: Hepatic cysts measuring up to 2.2cm  Medications   Current Outpatient Medications  Medication Sig Dispense Refill   BIOTIN PO Take by mouth.     CALCIUM  CITRATE PO Take 600 mg by mouth daily.     cholecalciferol (VITAMIN D3) 25 MCG (1000 UNIT) tablet Take 1,000 Units by mouth daily.     clobetasol  ointment (TEMOVATE ) 0.05 % Apply 1 Application topically 2 (two) times daily. 30 g 0   cyclobenzaprine  (FLEXERIL ) 5 MG tablet Take 1 tablet (5 mg total) by mouth 3 (three) times daily as needed for muscle spasms. 60 tablet 2   Docosahexaenoic Acid (DHA PO) Take by mouth.     ezetimibe  (ZETIA ) 10 MG tablet Take 1 tablet (10 mg total) by mouth daily. 30 tablet 11   levothyroxine  (SYNTHROID ) 75 MCG tablet Take 1 tablet (75 mcg total) by mouth daily. 90 tablet 3   Magnesium Citrate 100 MG CAPS Take 100 mg by mouth daily.     mometasone  (ELOCON ) 0.1 % cream Apply topically daily. Affected area 15 g 1   Multiple Vitamins-Minerals (WOMENS 50+ MULTI VITAMIN PO) Take 2 tablets by mouth daily.     Omega 3-6-9 Fatty  Acids (OMEGA DHA PO) Take 2,000 mg by mouth.     rosuvastatin  (CRESTOR ) 5 MG tablet Take 1 tablet (5 mg total) by mouth daily.     St Johns Wort 300 MG CAPS Take 300 mg by mouth daily.     Turmeric (QC TUMERIC COMPLEX) 500 MG CAPS Take 500 mg by mouth daily.     No current facility-administered medications for this visit.    Allergies   Allergies as of 09/25/2024 - Review Complete 09/25/2024  Allergen Reaction Noted   Elemental sulfur Rash 04/07/2017   Sulfur Other (See Comments) 04/07/2017    Past Medical History   Past Medical History:  Diagnosis Date   Cervical disc  disease    Coronary artery calcification seen on CT scan 06/16/2024   TTE 06/15/24: EF 65-70, no RWMA, Gr 1 DD, NL RVSF  CAC Score 06/15/24: 20.8 (67th percentile)     History of multiple concussions    63 yo first time   Hyperlipidemia    Ruptured lumbar disc    Thyroid  disease     Past Surgical History   Past Surgical History:  Procedure Laterality Date   BREAST BIOPSY Left 09/15/2024   US  LT BREAST BX W LOC DEV 1ST LESION IMG BX SPEC US  GUIDE 09/15/2024 [facility]   DILATION AND CURETTAGE OF UTERUS      Past Family History   Family History  Problem Relation Age of Onset   Hypertension Mother        Living, 84   Thyroid  disease Mother    Dementia Father    Cancer Father        prostate   Heart attack Father 45       died from MI   Thyroid  disease Sister    Healthy Daughter    Breast cancer Paternal Aunt    Breast cancer Cousin        paternal 1st cousin   Thyroid  disease Brother    Heart failure Brother     Past Social History   Social History   Socioeconomic History   Marital status: Married    Spouse name: Not on file   Number of children: 1   Years of education: Not on file   Highest education level: Not on file  Occupational History   Occupation: housewife  Tobacco Use   Smoking status: Former    Types: Cigarettes   Smokeless tobacco: Never  Vaping Use   Vaping status: Never Used  Substance and Sexual Activity   Alcohol use: No    Alcohol/week: 0.0 standard drinks of alcohol   Drug use: No   Sexual activity: Not on file  Other Topics Concern   Not on file  Social History Narrative   Lives with husband in a 2 story home.  Has 1 child.     Education: high school.  Housewife.     Social Drivers of Corporate Investment Banker Strain: Not on file  Food Insecurity: Not on file  Transportation Needs: Not on file  Physical Activity: Not on file  Stress: Not on file  Social Connections: Unknown (03/14/2022)   Received from [facility]    Social Network    Social Network: Not on file  Intimate Partner Violence: Unknown (02/03/2022)   Received from [facility]   HITS    Physically Hurt: Not on file    Insult or Talk Down To: Not on file    Threaten Physical Harm: Not on file  Scream or Curse: Not on file    Review of Systems   General: Negative for anorexia, weight loss, fever, chills, fatigue, weakness. Eyes: Negative for vision changes.  ENT: Negative for hoarseness, difficulty swallowing , nasal congestion. CV: Negative for chest pain, angina, palpitations, dyspnea on exertion, peripheral edema.  Respiratory: Negative for dyspnea at rest, dyspnea on exertion, cough, sputum, wheezing.  GI: See history of present illness. GU:  Negative for dysuria, hematuria, urinary incontinence, urinary frequency, nocturnal urination.  MS: Negative for joint pain, low back pain.  Derm: Negative for rash or itching.  Neuro: Negative for weakness, abnormal sensation, seizure, frequent headaches, memory loss,  confusion.  Psych: Negative for anxiety, depression, suicidal ideation, hallucinations.  Endo: Negative for unusual weight change.  Heme: Negative for bruising or bleeding. Allergy: Negative for rash or hives.  Physical Exam   BP 126/79   Pulse 88   Temp 98.4 F (36.9 C) (Oral)   Ht 5' 5 (1.651 m)   Wt 144 lb 12.8 oz (65.7 kg)   LMP 08/01/2015   SpO2 98%   BMI 24.10 kg/m    General: Well-nourished, well-developed in no acute distress.  Head: Normocephalic, atraumatic.   Eyes: Conjunctiva pink, no icterus. Mouth: Oropharyngeal mucosa moist and pink  Neck: Supple without thyromegaly, masses, or lymphadenopathy.  Lungs: Clear to auscultation bilaterally.  Heart: Regular rate and rhythm, no murmurs rubs or gallops.  Abdomen: Bowel sounds are normal, nontender, nondistended, no hepatosplenomegaly or masses, no abdominal bruits or hernia, no rebound or guarding.   Rectal: not performed Extremities: No lower  extremity edema. No clubbing or deformities.  Neuro: Alert and oriented x 4 , grossly normal neurologically.  Skin: Warm and dry, no rash or jaundice.   Psych: Alert and cooperative, normal mood and affect.  Labs   See above  Imaging Studies   US  LT BREAST BX W LOC DEV 1ST LESION IMG BX SPEC US  GUIDE Addendum Date: 09/19/2024 ADDENDUM REPORT: 09/19/2024 07:29 ADDENDUM: PATHOLOGY revealed: Breast, left, needle core biopsy, 10 o'clock 5 cmfn (coil clip)- FIBROCYSTIC CHANGES WITH AN ENLARGED CYST. NEGATIVE FOR ATYPIA AND MALIGNANCY. Pathology results are CONCORDANT with imaging findings, per Dr. Alm Parkins. Pathology results and recommendations were discussed with patient via telephone on 09/18/2024 by Rock Hover RN. Patient reported biopsy site doing well with no adverse symptoms, and slight tenderness at the site. Post biopsy care instructions were reviewed, questions were answered and my direct phone number was provided. Patient was instructed to call [facility] for any additional questions or concerns related to biopsy site. RECOMMENDATION: Patient to return in 6 months for LEFT breast diagnostic mammogram (and ultrasound if deemed necessary) to follow-up on the asymmetry which does not correlate with the biopsied mass. Patient informed a reminder notice will be sent regarding this appointment and she may need to call mammography site to schedule this appointment. Pathology results reported by Rock Hover RN on 09/18/2024. Electronically Signed   By: Alm Parkins M.D.   On: 09/19/2024 07:29   Result Date: 09/19/2024 CLINICAL DATA:  Patient presents for ultrasound-guided core needle biopsy of a left breast mass. EXAM: ULTRASOUND GUIDED LEFT BREAST CORE NEEDLE BIOPSY COMPARISON:  Previous exam(s). PROCEDURE: I met with the patient and we discussed the procedure of ultrasound-guided biopsy, including benefits and alternatives. We discussed the high likelihood of a successful  procedure. We discussed the risks of the procedure, including infection, bleeding, tissue injury, clip migration, and inadequate sampling. Informed written consent  was given. The usual time-out protocol was performed immediately prior to the procedure. Lesion quadrant: Upper inner quadrant Using sterile technique and 1% Lidocaine as local anesthetic, under direct ultrasound visualization, a 14 gauge spring-loaded device was used to perform biopsy of the 9 mm mass in the posterior breast at 10 o'clock using an inferior approach. At the conclusion of the procedure coil shaped shaped tissue marker clip was deployed into the biopsy cavity. Follow up 2 view mammogram was performed and dictated separately. IMPRESSION: Ultrasound guided biopsy of a right breast mass. No apparent complications. Electronically Signed: By: Alm Parkins M.D. On: 09/15/2024 14:33   MM CLIP PLACEMENT LEFT Result Date: 09/15/2024 CLINICAL DATA:  Assess post biopsy marker clip following ultrasound-guided core needle biopsy of a left breast mass. EXAM: 3D DIAGNOSTIC LEFT MAMMOGRAM POST ULTRASOUND BIOPSY COMPARISON:  Previous exam(s). ACR Breast Density Category c: The breasts are heterogeneously dense, which may obscure small masses. FINDINGS: 3D Mammographic images were obtained following ultrasound guided biopsy of a left breast mass. The biopsy marking clip is in the posterior, upper inner left breast in the expected location the sonographic mass. However, this does not correlate with the original asymmetry seen mammographically, which was more anterior and inferior on the screening MLO exam dated 08/23/2024. IMPRESSION: Appropriate positioning of the coil shaped biopsy marking clip at the site of biopsy in the posterior upper inner left breast. The biopsied mass does not correlate to the originally screening asymmetry. Final Assessment: Post Procedure Mammograms for Marker Placement Electronically Signed   By: Alm Parkins M.D.   On:  09/15/2024 14:40   MM 3D DIAGNOSTIC MAMMOGRAM UNILATERAL LEFT BREAST Result Date: 09/11/2024 CLINICAL DATA:  Recalled for possible LEFT breast asymmetry. EXAM: DIGITAL DIAGNOSTIC UNILATERAL LEFT MAMMOGRAM WITH TOMOSYNTHESIS AND CAD; ULTRASOUND LEFT BREAST LIMITED TECHNIQUE: Left digital diagnostic mammography and breast tomosynthesis was performed. The images were evaluated with computer-aided detection. ; Targeted ultrasound examination of the left breast was performed. COMPARISON:  Previous exam(s). ACR Breast Density Category c: The breasts are heterogeneously dense, which may obscure small masses. FINDINGS: LEFT: Mammogram: Full field MLO and ML and spot-compression MLO views of the LEFT breast were obtained. Suspected asymmetry persists in the LEFT breast at middle depth. Ultrasound: Targeted sonographic evaluation of the LEFT breast was performed. 0.9 x 0.4 x 0.7 cm oval mass with indistinct and angular margins is seen at 10 o'clock 5 CMFN, possibly corresponding to the mammographic mass. No enlarged LEFT axillary lymph nodes are identified. IMPRESSION: Indeterminate 0.9 cm LEFT breast mass (10 o'clock 5 CMFN), possibly corresponding to the mammographic asymmetry seen at middle depth. RECOMMENDATION: 1. Ultrasound-guided core needle biopsy of 0.9 cm LEFT breast mass (10 o'clock 5 CMFN). 2. Attention on post biopsy mammogram recommended to verify that the clip aligns with the asymmetry seen on mammogram. I have discussed the findings and recommendations with the patient. The biopsy procedure was explained to the patient and questions were answered. Patient expressed their understanding of the biopsy recommendation. Patient will be scheduled for biopsy at her earliest convenience by the schedulers. Ordering provider will be notified. If applicable, a reminder letter will be sent to the patient regarding the next appointment. BI-RADS CATEGORY  4: Suspicious. Electronically Signed   By: Aliene Lloyd M.D.   On:  09/11/2024 15:59   US  LIMITED ULTRASOUND INCLUDING AXILLA LEFT BREAST  Result Date: 09/11/2024 CLINICAL DATA:  Recalled for possible LEFT breast asymmetry. EXAM: DIGITAL DIAGNOSTIC UNILATERAL LEFT MAMMOGRAM WITH TOMOSYNTHESIS AND CAD; ULTRASOUND  LEFT BREAST LIMITED TECHNIQUE: Left digital diagnostic mammography and breast tomosynthesis was performed. The images were evaluated with computer-aided detection. ; Targeted ultrasound examination of the left breast was performed. COMPARISON:  Previous exam(s). ACR Breast Density Category c: The breasts are heterogeneously dense, which may obscure small masses. FINDINGS: LEFT: Mammogram: Full field MLO and ML and spot-compression MLO views of the LEFT breast were obtained. Suspected asymmetry persists in the LEFT breast at middle depth. Ultrasound: Targeted sonographic evaluation of the LEFT breast was performed. 0.9 x 0.4 x 0.7 cm oval mass with indistinct and angular margins is seen at 10 o'clock 5 CMFN, possibly corresponding to the mammographic mass. No enlarged LEFT axillary lymph nodes are identified. IMPRESSION: Indeterminate 0.9 cm LEFT breast mass (10 o'clock 5 CMFN), possibly corresponding to the mammographic asymmetry seen at middle depth. RECOMMENDATION: 1. Ultrasound-guided core needle biopsy of 0.9 cm LEFT breast mass (10 o'clock 5 CMFN). 2. Attention on post biopsy mammogram recommended to verify that the clip aligns with the asymmetry seen on mammogram. I have discussed the findings and recommendations with the patient. The biopsy procedure was explained to the patient and questions were answered. Patient expressed their understanding of the biopsy recommendation. Patient will be scheduled for biopsy at her earliest convenience by the schedulers. Ordering provider will be notified. If applicable, a reminder letter will be sent to the patient regarding the next appointment. BI-RADS CATEGORY  4: Suspicious. Electronically Signed   By: Aliene Lloyd M.D.    On: 09/11/2024 15:59    Assessment/Plan:    Assessment & Plan Hepatic cysts Multiple hepatic cysts incidentally identified on cardiac study. Largest cyst seen was 2.2cm. Normal liver function tests. No RUQ pain. - Ordered ruq u/s to fully visualized liver.   - Unless additional findings are significant, liver cysts of this size are typically not symptomatic and do not warrant further evaluation.  Constipation Chronic constipation with dry, hard stools although she is having daily stools. Inadequate water and fiber intake. Normal Cologuard test last year. - Increase water intake to at least 64 ounces per day. - Increase dietary fiber intake with fruits like golden kiwis, berries. Consume at least 5 servings of fruit/vegetables daily. - Consider Miralax 1/2-1 capful daily to soften stool. - Continue Cologuard testing every three years for colon cancer screening/            Sonny RAMAN. Ezzard, MHS, PA-C [facility]

## 2024-10-04 ENCOUNTER — Telehealth: Payer: Self-pay | Admitting: Physician Assistant

## 2024-10-04 DIAGNOSIS — E785 Hyperlipidemia, unspecified: Secondary | ICD-10-CM

## 2024-10-04 MED ORDER — ROSUVASTATIN CALCIUM 5 MG PO TABS: 5.0000 mg | ORAL_TABLET | Freq: Every day | ORAL | 1 refills | Status: AC

## 2024-10-04 NOTE — Telephone Encounter (Signed)
 Pt c/o medication issue:  1. Name of Medication: rosuvastatin  (CRESTOR ) 5 MG tablet   2. How are you currently taking this medication (dosage and times per day)? As written   3. Are you having a reaction (difficulty breathing--STAT)? no  4. What is your medication issue?   Pt was supposed to get a medication along with her Rosuvastatin  because she could not do the 10mg . Please advise.

## 2024-10-04 NOTE — Telephone Encounter (Signed)
 Spoke with patient. Advised zetia  already sent to pharmacy. She has not started. Needed refill of crestor  5mg . Rx(s) sent to pharmacy electronically.

## 2024-10-04 NOTE — Telephone Encounter (Signed)
 Glendia ONEIDA Ferrier, PA-C 09/17/2024 10:30 PM EST     Results sent to Sharlet KATHEE Pepper via MyChart. See MyChart comments below:  Ms. Sova  Your LDL cholesterol is still above goal. Your liver enzyme (ALT) is normal. You could not tolerate the higher dose of Rosuvastatin . I would like to add Zetia  10 mg once daily to your current medications. This is not a statin but works well with Rosuvastatin  to drive the LDL lower. The goal is to get the LDL less than 70. I will send the Zetia  to your pharmacy. Please return to the lab in 3 mos (mid February). You do not need an appointment. Please come fasting (nothing to eat or drink after midnight).  Glendia Ferrier, PA-C    09/17/2024 10:22 PM      ezetimibe  (ZETIA ) 10 MG tablet 10 mg, Daily 11 ordered        Summary: Take 1 tablet (10 mg total) by mouth daily., Starting Sun 09/17/2024, Until Sat 12/16/2024, Normal Dose, Route, Frequency: 10 mg, Oral, DailyStart: 11/16/2025End: 02/14/2026Ordered On: 11/16/2025Pharmacy: [id number] - [place], [place] - [address]ReportDx Associated: Taking: Long-term: Med Note:        Change Discontinue Directions: Take 1 tablet (10 mg total) by mouth daily. Ordering Department: [facility] Authorized By: Ferrier Glendia ONEIDA, PA-C Dispense: 30 tablet

## 2024-10-06 ENCOUNTER — Ambulatory Visit (HOSPITAL_COMMUNITY)

## 2024-10-13 ENCOUNTER — Ambulatory Visit (HOSPITAL_COMMUNITY): Admission: RE | Admit: 2024-10-13 | Discharge: 2024-10-13 | Attending: Gastroenterology | Admitting: Gastroenterology

## 2024-10-21 ENCOUNTER — Ambulatory Visit: Payer: Self-pay | Admitting: Gastroenterology
